# Patient Record
Sex: Female | Born: 1935 | Race: White | Hispanic: No | Marital: Married | Smoking: Never smoker
Health system: Southern US, Community
[De-identification: ages and names within clinical notes are randomized; demographics above are authoritative.]

## PROBLEM LIST (undated history)

## (undated) DIAGNOSIS — Z889 Allergy status to unspecified drugs, medicaments and biological substances status: Secondary | ICD-10-CM

## (undated) DIAGNOSIS — M5430 Sciatica, unspecified side: Secondary | ICD-10-CM

## (undated) DIAGNOSIS — M1990 Unspecified osteoarthritis, unspecified site: Secondary | ICD-10-CM

## (undated) DIAGNOSIS — E039 Hypothyroidism, unspecified: Secondary | ICD-10-CM

## (undated) DIAGNOSIS — I10 Essential (primary) hypertension: Secondary | ICD-10-CM

## (undated) HISTORY — PX: NASAL SINUS SURGERY: SHX719

---

## 1997-12-04 ENCOUNTER — Other Ambulatory Visit: Admission: RE | Admit: 1997-12-04 | Discharge: 1997-12-04 | Payer: Self-pay | Admitting: Obstetrics & Gynecology

## 1998-12-25 ENCOUNTER — Other Ambulatory Visit: Admission: RE | Admit: 1998-12-25 | Discharge: 1998-12-25 | Payer: Self-pay | Admitting: Obstetrics & Gynecology

## 1999-05-11 ENCOUNTER — Ambulatory Visit (HOSPITAL_COMMUNITY): Admission: RE | Admit: 1999-05-11 | Discharge: 1999-05-11 | Payer: Self-pay | Admitting: *Deleted

## 1999-05-11 ENCOUNTER — Encounter: Payer: Self-pay | Admitting: *Deleted

## 2000-09-24 ENCOUNTER — Other Ambulatory Visit: Admission: RE | Admit: 2000-09-24 | Discharge: 2000-09-24 | Payer: Self-pay | Admitting: Obstetrics & Gynecology

## 2000-11-26 ENCOUNTER — Encounter: Admission: RE | Admit: 2000-11-26 | Discharge: 2000-11-26 | Payer: Self-pay | Admitting: Internal Medicine

## 2000-11-26 ENCOUNTER — Encounter: Payer: Self-pay | Admitting: Internal Medicine

## 2002-01-10 ENCOUNTER — Other Ambulatory Visit: Admission: RE | Admit: 2002-01-10 | Discharge: 2002-01-10 | Payer: Self-pay | Admitting: Obstetrics & Gynecology

## 2003-03-27 ENCOUNTER — Ambulatory Visit (HOSPITAL_COMMUNITY): Admission: RE | Admit: 2003-03-27 | Discharge: 2003-03-27 | Payer: Self-pay | Admitting: Gastroenterology

## 2005-01-21 ENCOUNTER — Other Ambulatory Visit: Admission: RE | Admit: 2005-01-21 | Discharge: 2005-01-21 | Payer: Self-pay | Admitting: Obstetrics & Gynecology

## 2013-02-03 HISTORY — PX: REPAIR NONUNION / MALUNION FEMUR: SUR1193

## 2013-12-28 ENCOUNTER — Encounter (HOSPITAL_COMMUNITY): Payer: Self-pay

## 2013-12-28 ENCOUNTER — Emergency Department (HOSPITAL_COMMUNITY): Payer: Worker's Compensation

## 2013-12-28 ENCOUNTER — Inpatient Hospital Stay (HOSPITAL_COMMUNITY): Payer: Worker's Compensation | Admitting: Anesthesiology

## 2013-12-28 ENCOUNTER — Inpatient Hospital Stay (HOSPITAL_COMMUNITY): Payer: Worker's Compensation

## 2013-12-28 ENCOUNTER — Encounter (HOSPITAL_COMMUNITY)
Admission: EM | Disposition: A | Discharge status: Home Health | Payer: Self-pay | Source: Home / Self Care | Attending: Internal Medicine

## 2013-12-28 ENCOUNTER — Inpatient Hospital Stay (HOSPITAL_COMMUNITY)
Admission: EM | Admit: 2013-12-28 | Discharge: 2014-01-02 | DRG: 481 | Disposition: A | Discharge status: Home Health | Payer: Worker's Compensation | Attending: Internal Medicine | Admitting: Internal Medicine

## 2013-12-28 DIAGNOSIS — D62 Acute posthemorrhagic anemia: Secondary | ICD-10-CM | POA: Diagnosis not present

## 2013-12-28 DIAGNOSIS — R52 Pain, unspecified: Secondary | ICD-10-CM

## 2013-12-28 DIAGNOSIS — Z01818 Encounter for other preprocedural examination: Secondary | ICD-10-CM | POA: Insufficient documentation

## 2013-12-28 DIAGNOSIS — I10 Essential (primary) hypertension: Secondary | ICD-10-CM | POA: Diagnosis present

## 2013-12-28 DIAGNOSIS — Z880 Allergy status to penicillin: Secondary | ICD-10-CM | POA: Diagnosis not present

## 2013-12-28 DIAGNOSIS — S7223XA Displaced subtrochanteric fracture of unspecified femur, initial encounter for closed fracture: Secondary | ICD-10-CM | POA: Insufficient documentation

## 2013-12-28 DIAGNOSIS — S7221XA Displaced subtrochanteric fracture of right femur, initial encounter for closed fracture: Secondary | ICD-10-CM | POA: Diagnosis present

## 2013-12-28 DIAGNOSIS — E876 Hypokalemia: Secondary | ICD-10-CM | POA: Diagnosis not present

## 2013-12-28 DIAGNOSIS — W19XXXA Unspecified fall, initial encounter: Secondary | ICD-10-CM | POA: Diagnosis present

## 2013-12-28 DIAGNOSIS — S7291XA Unspecified fracture of right femur, initial encounter for closed fracture: Secondary | ICD-10-CM

## 2013-12-28 DIAGNOSIS — S72009A Fracture of unspecified part of neck of unspecified femur, initial encounter for closed fracture: Secondary | ICD-10-CM | POA: Insufficient documentation

## 2013-12-28 DIAGNOSIS — E039 Hypothyroidism, unspecified: Secondary | ICD-10-CM | POA: Diagnosis present

## 2013-12-28 DIAGNOSIS — S72001A Fracture of unspecified part of neck of right femur, initial encounter for closed fracture: Secondary | ICD-10-CM

## 2013-12-28 HISTORY — DX: Essential (primary) hypertension: I10

## 2013-12-28 HISTORY — DX: Allergy status to unspecified drugs, medicaments and biological substances: Z88.9

## 2013-12-28 HISTORY — PX: FEMUR IM NAIL: SHX1597

## 2013-12-28 HISTORY — PX: FEMUR FRACTURE SURGERY: SHX633

## 2013-12-28 LAB — CBC WITH DIFFERENTIAL/PLATELET
BASOS ABS: 0 10*3/uL (ref 0.0–0.1)
BASOS PCT: 0 % (ref 0–1)
EOS PCT: 3 % (ref 0–5)
Eosinophils Absolute: 0.2 10*3/uL (ref 0.0–0.7)
HEMATOCRIT: 35.1 % — AB (ref 36.0–46.0)
Hemoglobin: 11.6 g/dL — ABNORMAL LOW (ref 12.0–15.0)
LYMPHS PCT: 19 % (ref 12–46)
Lymphs Abs: 1.6 10*3/uL (ref 0.7–4.0)
MCH: 28.9 pg (ref 26.0–34.0)
MCHC: 33 g/dL (ref 30.0–36.0)
MCV: 87.3 fL (ref 78.0–100.0)
MONO ABS: 0.3 10*3/uL (ref 0.1–1.0)
MONOS PCT: 4 % (ref 3–12)
Neutro Abs: 6.3 10*3/uL (ref 1.7–7.7)
Neutrophils Relative %: 74 % (ref 43–77)
Platelets: 206 10*3/uL (ref 150–400)
RBC: 4.02 MIL/uL (ref 3.87–5.11)
RDW: 13.4 % (ref 11.5–15.5)
WBC: 8.4 10*3/uL (ref 4.0–10.5)

## 2013-12-28 LAB — BASIC METABOLIC PANEL
Anion gap: 13 (ref 5–15)
BUN: 25 mg/dL — ABNORMAL HIGH (ref 6–23)
CO2: 26 meq/L (ref 19–32)
CREATININE: 0.83 mg/dL (ref 0.50–1.10)
Calcium: 9 mg/dL (ref 8.4–10.5)
Chloride: 98 mEq/L (ref 96–112)
GFR calc Af Amer: 77 mL/min — ABNORMAL LOW (ref >90)
GFR calc non Af Amer: 67 mL/min — ABNORMAL LOW (ref >90)
GLUCOSE: 119 mg/dL — AB (ref 70–99)
Potassium: 3.4 mEq/L — ABNORMAL LOW (ref 3.7–5.3)
Sodium: 137 mEq/L (ref 137–147)

## 2013-12-28 LAB — MRSA PCR SCREENING: MRSA by PCR: NEGATIVE

## 2013-12-28 LAB — ABO/RH: ABO/RH(D): O POS

## 2013-12-28 LAB — PROTIME-INR
INR: 1.08 (ref 0.00–1.49)
Prothrombin Time: 14.1 seconds (ref 11.6–15.2)

## 2013-12-28 IMAGING — CR DG HIP 1V PORT*R*
1 series · 1 of 1 positions shown · non-contrast
Comparison: [DATE]

CLINICAL DATA: Postop right hip fracture

EXAM:
PORTABLE RIGHT HIP - 1 VIEW

[AP]
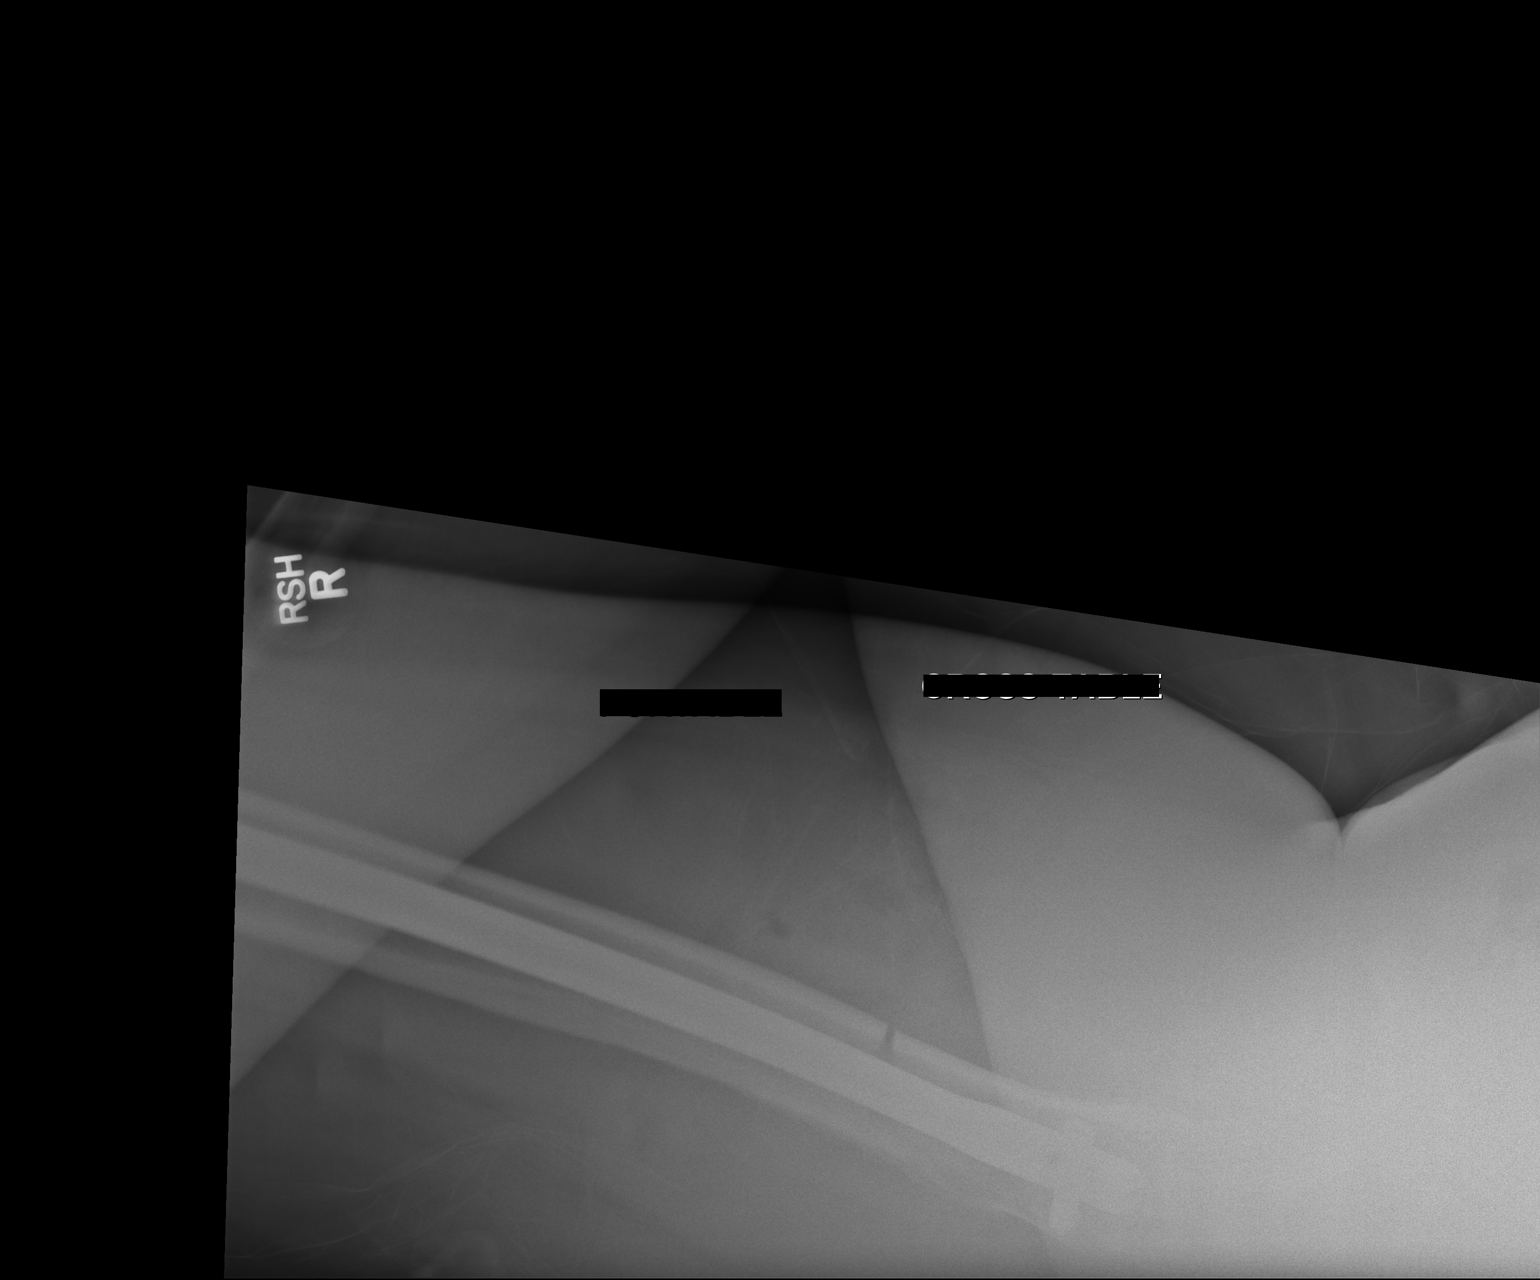

[1 of 1 positions shown; findings below may reference images not displayed]

FINDINGS: Lateral image of the right hip shows changes of subtrochanteric
femur fracture ORIF using an intra medullary nail. In the lateral
projection the fracture alignment is excellent.
IMPRESSION: Subtrochanteric right femur fracture ORIF.  No adverse findings.

## 2013-12-28 SURGERY — INSERTION, INTRAMEDULLARY ROD, FEMUR
Anesthesia: General | Site: Leg Upper | Laterality: Right

## 2013-12-28 MED ORDER — LIDOCAINE HCL (CARDIAC) 20 MG/ML IV SOLN
INTRAVENOUS | Status: AC
  Filled 2013-12-28: qty 5

## 2013-12-28 MED ORDER — METHOCARBAMOL 500 MG PO TABS: 500.0000 mg | ORAL_TABLET | Freq: Four times a day (QID) | ORAL | Status: DC | PRN

## 2013-12-28 MED ORDER — ONDANSETRON HCL 4 MG/2ML IJ SOLN: 4.0000 mg | Freq: Once | INTRAMUSCULAR | Status: AC | PRN

## 2013-12-28 MED ORDER — SENNA-DOCUSATE SODIUM 8.6-50 MG PO TABS: 2.0000 | ORAL_TABLET | Freq: Every day | ORAL | Status: AC

## 2013-12-28 MED ORDER — MIDAZOLAM HCL 5 MG/5ML IJ SOLN
INTRAMUSCULAR | Status: DC | PRN
  Administered 2013-12-28: 2 mg via INTRAVENOUS

## 2013-12-28 MED ORDER — ONDANSETRON HCL 4 MG/2ML IJ SOLN: 4.0000 mg | Freq: Three times a day (TID) | INTRAMUSCULAR | Status: DC | PRN

## 2013-12-28 MED ORDER — LACTATED RINGERS IV SOLN
INTRAVENOUS | Status: DC | PRN
  Administered 2013-12-28 (×2): via INTRAVENOUS

## 2013-12-28 MED ORDER — POTASSIUM CHLORIDE CRYS ER 10 MEQ PO TBCR
10.0000 meq | EXTENDED_RELEASE_TABLET | Freq: Every day | ORAL | Status: DC
  Administered 2013-12-29: 10 meq via ORAL
  Filled 2013-12-28 (×2): qty 1

## 2013-12-28 MED ORDER — HEPARIN SODIUM (PORCINE) 5000 UNIT/ML IJ SOLN: 5000.0000 [IU] | Freq: Three times a day (TID) | INTRAMUSCULAR | Status: DC

## 2013-12-28 MED ORDER — AMLODIPINE BESY-BENAZEPRIL HCL 10-20 MG PO CAPS: 1.0000 | ORAL_CAPSULE | Freq: Every day | ORAL | Status: DC

## 2013-12-28 MED ORDER — ROPIVACAINE HCL 5 MG/ML IJ SOLN
INTRAMUSCULAR | Status: DC | PRN
  Administered 2013-12-28: 20 mL via PERINEURAL

## 2013-12-28 MED ORDER — 0.9 % SODIUM CHLORIDE (POUR BTL) OPTIME
TOPICAL | Status: DC | PRN
  Administered 2013-12-28: 1000 mL

## 2013-12-28 MED ORDER — MORPHINE SULFATE 2 MG/ML IJ SOLN
0.5000 mg | INTRAMUSCULAR | Status: DC | PRN
Start: 2013-12-28 — End: 2013-12-29

## 2013-12-28 MED ORDER — HYDROCODONE-ACETAMINOPHEN 5-325 MG PO TABS: 1.0000 | ORAL_TABLET | Freq: Four times a day (QID) | ORAL | Status: DC | PRN

## 2013-12-28 MED ORDER — FENTANYL CITRATE 0.05 MG/ML IJ SOLN
INTRAMUSCULAR | Status: AC
  Filled 2013-12-28: qty 5

## 2013-12-28 MED ORDER — AMLODIPINE BESYLATE 10 MG PO TABS
10.0000 mg | ORAL_TABLET | Freq: Every day | ORAL | Status: DC
  Administered 2013-12-29 – 2014-01-02 (×5): 10 mg via ORAL
  Filled 2013-12-28 (×6): qty 1

## 2013-12-28 MED ORDER — LIDOCAINE HCL (CARDIAC) 20 MG/ML IV SOLN
INTRAVENOUS | Status: DC | PRN
  Administered 2013-12-28: 80 mg via INTRAVENOUS

## 2013-12-28 MED ORDER — SUCCINYLCHOLINE CHLORIDE 20 MG/ML IJ SOLN
INTRAMUSCULAR | Status: AC
  Filled 2013-12-28: qty 1

## 2013-12-28 MED ORDER — HYDROCODONE-ACETAMINOPHEN 5-325 MG PO TABS: 1.0000 | ORAL_TABLET | ORAL | Status: DC | PRN

## 2013-12-28 MED ORDER — HYDROMORPHONE HCL 1 MG/ML IJ SOLN
1.0000 mg | Freq: Once | INTRAMUSCULAR | Status: AC
Start: 2013-12-28 — End: 2013-12-28
  Administered 2013-12-28: 1 mg via INTRAVENOUS
  Filled 2013-12-28: qty 1

## 2013-12-28 MED ORDER — ROCURONIUM BROMIDE 50 MG/5ML IV SOLN
INTRAVENOUS | Status: AC
  Filled 2013-12-28: qty 1

## 2013-12-28 MED ORDER — NEOSTIGMINE METHYLSULFATE 10 MG/10ML IV SOLN
INTRAVENOUS | Status: AC
  Filled 2013-12-28: qty 1

## 2013-12-28 MED ORDER — FENTANYL CITRATE 0.05 MG/ML IJ SOLN
INTRAMUSCULAR | Status: DC | PRN
  Administered 2013-12-28: 100 ug via INTRAVENOUS
  Administered 2013-12-28: 50 ug via INTRAVENOUS

## 2013-12-28 MED ORDER — POTASSIUM CHLORIDE IN NACL 20-0.45 MEQ/L-% IV SOLN
INTRAVENOUS | Status: DC
  Administered 2013-12-28: 21:00:00 via INTRAVENOUS
  Filled 2013-12-28 (×2): qty 1000

## 2013-12-28 MED ORDER — ONDANSETRON HCL 4 MG/2ML IJ SOLN
INTRAMUSCULAR | Status: AC
  Filled 2013-12-28: qty 2

## 2013-12-28 MED ORDER — FENTANYL CITRATE 0.05 MG/ML IJ SOLN
50.0000 ug | INTRAMUSCULAR | Status: AC | PRN
Start: 2013-12-28 — End: 2013-12-28
  Administered 2013-12-28 (×2): 50 ug via INTRAVENOUS
  Filled 2013-12-28 (×2): qty 2

## 2013-12-28 MED ORDER — POLYETHYLENE GLYCOL 3350 17 G PO PACK: 17.0000 g | PACK | Freq: Every day | ORAL | Status: DC | PRN

## 2013-12-28 MED ORDER — CEFAZOLIN SODIUM-DEXTROSE 2-3 GM-% IV SOLR
INTRAVENOUS | Status: DC | PRN
  Administered 2013-12-28: 2 g via INTRAVENOUS

## 2013-12-28 MED ORDER — PROPOFOL 10 MG/ML IV BOLUS
INTRAVENOUS | Status: AC
  Filled 2013-12-28: qty 20

## 2013-12-28 MED ORDER — MIDAZOLAM HCL 2 MG/2ML IJ SOLN
INTRAMUSCULAR | Status: AC
  Filled 2013-12-28: qty 2

## 2013-12-28 MED ORDER — PHENYLEPHRINE HCL 10 MG/ML IJ SOLN
10.0000 mg | INTRAVENOUS | Status: DC | PRN
  Administered 2013-12-28: 40 ug/min via INTRAVENOUS

## 2013-12-28 MED ORDER — SODIUM CHLORIDE 0.9 % IV SOLN
1000.0000 mL | INTRAVENOUS | Status: DC
  Administered 2013-12-28: 1000 mL via INTRAVENOUS

## 2013-12-28 MED ORDER — BUPIVACAINE-EPINEPHRINE (PF) 0.5% -1:200000 IJ SOLN
INTRAMUSCULAR | Status: DC | PRN
Start: 2013-12-28 — End: 2013-12-29
  Administered 2013-12-28: 20 mL via PERINEURAL

## 2013-12-28 MED ORDER — METHOCARBAMOL 1000 MG/10ML IJ SOLN
500.0000 mg | Freq: Four times a day (QID) | INTRAVENOUS | Status: DC | PRN
  Filled 2013-12-28: qty 5

## 2013-12-28 MED ORDER — CEFAZOLIN SODIUM-DEXTROSE 2-3 GM-% IV SOLR
2.0000 g | INTRAVENOUS | Status: AC
  Administered 2013-12-28: 2 g via INTRAVENOUS
  Filled 2013-12-28: qty 50

## 2013-12-28 MED ORDER — HYDROMORPHONE HCL 1 MG/ML IJ SOLN
0.5000 mg | INTRAMUSCULAR | Status: DC | PRN
  Administered 2013-12-28: 0.5 mg via INTRAVENOUS
  Filled 2013-12-28: qty 1

## 2013-12-28 MED ORDER — LEVOTHYROXINE SODIUM 88 MCG PO TABS
88.0000 ug | ORAL_TABLET | Freq: Every day | ORAL | Status: DC
  Administered 2013-12-29 – 2014-01-02 (×5): 88 ug via ORAL
  Filled 2013-12-28 (×6): qty 1

## 2013-12-28 MED ORDER — BENAZEPRIL HCL 20 MG PO TABS
20.0000 mg | ORAL_TABLET | Freq: Every day | ORAL | Status: DC
Start: 2013-12-28 — End: 2014-01-02
  Administered 2013-12-29 – 2014-01-01 (×4): 20 mg via ORAL
  Filled 2013-12-28 (×6): qty 1

## 2013-12-28 MED ORDER — FENTANYL CITRATE 0.05 MG/ML IJ SOLN
25.0000 ug | INTRAMUSCULAR | Status: DC | PRN
Start: 2013-12-28 — End: 2013-12-29
  Administered 2013-12-29: 50 ug via INTRAVENOUS

## 2013-12-28 MED ORDER — ENOXAPARIN SODIUM 40 MG/0.4ML ~~LOC~~ SOLN: 40.0000 mg | SUBCUTANEOUS | Status: DC

## 2013-12-28 MED ORDER — SUCCINYLCHOLINE CHLORIDE 20 MG/ML IJ SOLN
INTRAMUSCULAR | Status: DC | PRN
  Administered 2013-12-28: 100 mg via INTRAVENOUS

## 2013-12-28 MED ORDER — LABETALOL HCL 300 MG PO TABS
300.0000 mg | ORAL_TABLET | Freq: Two times a day (BID) | ORAL | Status: DC
  Administered 2013-12-29 – 2014-01-02 (×7): 300 mg via ORAL
  Filled 2013-12-28 (×11): qty 1

## 2013-12-28 MED ORDER — GLYCOPYRROLATE 0.2 MG/ML IJ SOLN
INTRAMUSCULAR | Status: AC
  Filled 2013-12-28: qty 2

## 2013-12-28 MED ORDER — ONDANSETRON HCL 4 MG/2ML IJ SOLN
INTRAMUSCULAR | Status: DC | PRN
  Administered 2013-12-28: 4 mg via INTRAVENOUS

## 2013-12-28 MED ORDER — SODIUM CHLORIDE 0.9 % IV SOLN
INTRAVENOUS | Status: DC
  Filled 2013-12-28: qty 1000

## 2013-12-28 MED ORDER — PROPOFOL 10 MG/ML IV BOLUS
INTRAVENOUS | Status: DC | PRN
  Administered 2013-12-28: 80 mg via INTRAVENOUS

## 2013-12-28 SURGICAL SUPPLY — 48 items
APL SKNCLS STERI-STRIP NONHPOA (GAUZE/BANDAGES/DRESSINGS) ×1
BENZOIN TINCTURE PRP APPL 2/3 (GAUZE/BANDAGES/DRESSINGS) ×3 IMPLANT
BIT DRILL 3.8X6 NS (BIT) ×2 IMPLANT
BIT DRILL 5.3 NS (BIT) ×2 IMPLANT
BOOTCOVER CLEANROOM LRG (PROTECTIVE WEAR) ×6 IMPLANT
CLOSURE STERI-STRIP 1/2X4 (GAUZE/BANDAGES/DRESSINGS) ×1
CLSR STERI-STRIP ANTIMIC 1/2X4 (GAUZE/BANDAGES/DRESSINGS) ×2 IMPLANT
COVER MAYO STAND STRL (DRAPES) ×2 IMPLANT
COVER PERINEAL POST (MISCELLANEOUS) ×3 IMPLANT
COVER SURGICAL LIGHT HANDLE (MISCELLANEOUS) ×3 IMPLANT
DRAPE STERI IOBAN 125X83 (DRAPES) ×3 IMPLANT
DRSG MEPILEX BORDER 4X4 (GAUZE/BANDAGES/DRESSINGS) ×7 IMPLANT
DRSG MEPILEX BORDER 4X8 (GAUZE/BANDAGES/DRESSINGS) ×6 IMPLANT
DURAPREP 26ML APPLICATOR (WOUND CARE) ×3 IMPLANT
ELECT CAUTERY BLADE 6.4 (BLADE) ×3 IMPLANT
ELECT REM PT RETURN 9FT ADLT (ELECTROSURGICAL) ×3
ELECTRODE REM PT RTRN 9FT ADLT (ELECTROSURGICAL) ×1 IMPLANT
EVACUATOR 1/8 PVC DRAIN (DRAIN) IMPLANT
FACESHIELD WRAPAROUND (MASK) ×6 IMPLANT
FACESHIELD WRAPAROUND OR TEAM (MASK) ×2 IMPLANT
GAUZE XEROFORM 5X9 LF (GAUZE/BANDAGES/DRESSINGS) ×3 IMPLANT
GLOVE BIOGEL PI ORTHO PRO SZ8 (GLOVE) ×2
GLOVE ORTHO TXT STRL SZ7.5 (GLOVE) ×6 IMPLANT
GLOVE PI ORTHO PRO STRL SZ8 (GLOVE) ×1 IMPLANT
GLOVE SURG ORTHO 8.0 STRL STRW (GLOVE) ×6 IMPLANT
GOWN STRL REUS W/ TWL LRG LVL3 (GOWN DISPOSABLE) IMPLANT
GOWN STRL REUS W/TWL LRG LVL3 (GOWN DISPOSABLE) ×3
GUIDEWIRE BALL NOSE 100CM (WIRE) ×2 IMPLANT
KIT ROOM TURNOVER OR (KITS) ×3 IMPLANT
LINER BOOT UNIVERSAL DISP (MISCELLANEOUS) ×3 IMPLANT
MANIFOLD NEPTUNE II (INSTRUMENTS) ×3 IMPLANT
NAIL TROCH RH 11MMX38CM (Nail) ×2 IMPLANT
NS IRRIG 1000ML POUR BTL (IV SOLUTION) ×3 IMPLANT
PACK GENERAL/GYN (CUSTOM PROCEDURE TRAY) ×3 IMPLANT
PAD ARMBOARD 7.5X6 YLW CONV (MISCELLANEOUS) ×6 IMPLANT
SCREW ACE CORTICAL (Screw) ×6 IMPLANT
SCREW ACECAP 44MM (Screw) ×2 IMPLANT
SCREW BN FT 75X6.5XST DRV (Screw) IMPLANT
SCREW BN FT 80X6.5XST DRV (Screw) IMPLANT
STAPLER VISISTAT 35W (STAPLE) ×3 IMPLANT
SUT VIC AB 0 CTB1 27 (SUTURE) ×3 IMPLANT
SUT VIC AB 2-0 FS1 27 (SUTURE) ×1 IMPLANT
SUT VIC AB 2-0 SH 27 (SUTURE)
SUT VIC AB 2-0 SH 27XBRD (SUTURE) IMPLANT
SUT VIC AB 3-0 SH 8-18 (SUTURE) ×3 IMPLANT
TOWEL OR 17X24 6PK STRL BLUE (TOWEL DISPOSABLE) ×3 IMPLANT
TOWEL OR 17X26 10 PK STRL BLUE (TOWEL DISPOSABLE) ×3 IMPLANT
WATER STERILE IRR 1000ML POUR (IV SOLUTION) ×3 IMPLANT

## 2013-12-28 NOTE — Anesthesia Procedure Notes (Addendum)
Date/Time: 12/28/2013 9:35 PM Performed by: Isidore MoosPAXTON, LYNN A Patient Re-evaluated:Patient Re-evaluated prior to inductionOxygen Delivery Method: Circle system utilized Preoxygenation: Pre-oxygenation with 100% oxygen Intubation Type: IV induction Ventilation: Mask ventilation without difficulty Laryngoscope Size: Miller and 2 Grade View: Grade I Tube type: Oral Tube size: 7.5 mm Number of attempts: 1 Airway Equipment and Method: Stylet   Anesthesia Regional Block:  Femoral nerve block  Pre-Anesthetic Checklist: ,, timeout performed, Correct Patient, Correct Site, Correct Laterality, Correct Procedure, Correct Position, site marked, Risks and benefits discussed,  Surgical consent,  Pre-op evaluation,  At surgeon's request and post-op pain management  Laterality: Right  Prep: chloraprep       Needles:  Injection technique: Single-shot  Needle Type: Echogenic Needle     Needle Length: 9cm 9 cm Needle Gauge: 21 and 21 G    Additional Needles:  Procedures: ultrasound guided (picture in chart) Femoral nerve block Narrative:  Start time: 12/28/2013 11:14 PM End time: 12/28/2013 11:20 PM Injection made incrementally with aspirations every 5 mL.  Performed by: Personally  Anesthesiologist: Shawntelle Ungar A  Additional Notes: Block performed was Fascia Iliaca Block, though the template states Femoral Block.

## 2013-12-28 NOTE — Progress Notes (Signed)
Orthopedic Tech Progress Note Patient Details:  Molly [name] 09/11/1935 [id number] Patient refused ohf  Patient ID: Molly FessMary A [name], female   DOB: 09/11/1935, 78 y.o.   MRN: [id number]   Jennye MoccasinHughes, [name] 12/28/2013, 7:31 PM

## 2013-12-28 NOTE — ED Notes (Signed)
Ortho Tech paged for traction

## 2013-12-28 NOTE — Op Note (Signed)
DATE OF SURGERY:  12/28/2013  TIME: 11:25 PM  PATIENT NAME:  Molly [name]  AGE: 78 y.o.  PRE-OPERATIVE DIAGNOSIS:  Right subtrochanteric femur fracture  POST-OPERATIVE DIAGNOSIS:  SAME  PROCEDURE:  Intramedullary nail, right subtrochanteric femur fracture  SURGEON:  Can Lucci P  ASSISTANT:  Janace LittenBrandon Parry, OPA-C, present and scrubbed throughout the case, critical for assistance with exposure, retraction, instrumentation, and closure.  OPERATIVE IMPLANTS: Biomet antegrade trochanteric femoral nail with a proximal interlocking bolts from the greater trochanter to the lesser trochanter and a single distal interlocking bolt  PREOPERATIVE INDICATIONS:  Molly [name] is a 78 y.o. year old who fell and suffered a hip fracture. She was brought into the ER and then admitted and optimized and then elected for surgical intervention.    The risks benefits and alternatives were discussed with the patient including but not limited to the risks of nonoperative treatment, versus surgical intervention including infection, bleeding, nerve injury, malunion, nonunion, hardware prominence, hardware failure, need for hardware removal, blood clots, cardiopulmonary complications, morbidity, mortality, among others, and they were willing to proceed.    OPERATIVE PROCEDURE:  The patient was brought to the operating room and placed in the supine position. General anesthesia was administered.  A Foley was not utilized. She was placed on the fracture table.  Closed reduction was not possible due to the fracture pattern.  She received preoperative antibiotics in the form of Ancef, without any significant reaction. This was despite her penicillin allergy.  After a sterile prep and drape, I made a small incision over the femur at the fracture site, and applied a clamp taking care to preserve soft tissue coverage. This reduced the fracture anatomically.  Incision was made proximal to the greater trochanter. A  guidewire was placed in the appropriate position. Confirmation was made on AP and lateral views. The above-named nail was opened. I opened the proximal femur with a reamer. I then reamed up to a 12.5, as the femur was fairly open, and then placed a size 11 nail.  Once the nail was completely seated, I secured the nail proximally with with a screw from the greater trochanter to lesser trochanter. I had excellent purchase. There was no femoral neck fracture, which I scrutinized preoperatively, and therefore did not need fixation into the head.   I then went distally, and secured the nail with a distal interlocking bolt.  Rotational alignment was confirmed clinically, as well as by radiographic alignment of the cortices thickness. I secured the nail distally with perfect circles technique.  I then removed the instruments, and took final C-arm pictures AP and lateral the entire length of the leg. Anatomic reconstruction was achieved, and the wounds were irrigated copiously and closed with Vicryl followed by Steri-Strips and sterile gauze for the skin. The patient was awakened and returned to PACU in stable and satisfactory condition. There no complications and the patient tolerated the procedure well.  She will be weightbearing as tolerated, and will be on Lovenox  for a period of three weeks after discharge.   Teryl LucyJoshua Dalayza Zambrana, M.D.

## 2013-12-28 NOTE — Progress Notes (Signed)
Orthopedic Tech Progress Note Patient Details:  Molly [name] 09/11/1935 [id number] Applied Hare traction splint to RLE.  Pulses, sensation, motion intact before and after application.  Capillary refill less than 2 seconds before and after application. Musculoskeletal Traction Type of Traction: Hare Traction Traction Location: RLE    Lesle ChrisGilliland, Tajha Sammarco L 12/28/2013, 2:28 PM

## 2013-12-28 NOTE — H&P (Signed)
[facility] History and Physical  Molly [name] ZOX:[id number] DOB: 09/11/1935 DOA: 12/28/2013  Referring physician: Dr. Fonnie Jarvis PCP: Lillia Mountain, MD   Chief Complaint: right hip pain  HPI: Molly [name] is a 78 y.o. female  78 year old female with past medical history of hypertension that lives with her family, that comes in for severe right hip pain and thigh pain after a fall. The family relates that she fell from her feet did not loose consciousness, no weakness or numbness. She did not hit her head she just has no chest pain or back pain. She relates her last meal was more than 4 hours prior to visit to the ED.  No diarrhea, nausea, vomiting, dysuria, cough or shortness of breath.  In the ED: She was given IV morphine x-rays was done as below and we are consulted for further evaluation.   Review of Systems:  Constitutional:  No weight loss, night sweats, Fevers, chills, fatigue.  HEENT:  No headaches, Difficulty swallowing,Tooth/dental problems,Sore throat,  No sneezing, itching, ear ache, nasal congestion, post nasal drip,  Cardio-vascular:  No chest pain, Orthopnea, PND, swelling in lower extremities, anasarca, dizziness, palpitations  GI:  No heartburn, indigestion, abdominal pain, nausea, vomiting, diarrhea, change in bowel habits, loss of appetite  Resp:  No shortness of breath with exertion or at rest. No excess mucus, no productive cough, No non-productive cough, No coughing up of blood.No change in color of mucus.No wheezing.No chest wall deformity  Skin:  no rash or lesions.  GU:  no dysuria, change in color of urine, no urgency or frequency. No flank pain.  Musculoskeletal:  No joint pain or swelling. No decreased range of motion. No back pain.  Psych:  No change in mood or affect. No depression or anxiety. No memory loss.   Past Medical History  Diagnosis Date  . Hypertension    History reviewed. No pertinent past surgical history. Social  History:  reports that she has never smoked. She does not have any smokeless tobacco history on file. She reports that she does not drink alcohol or use illicit drugs.  Allergies  Allergen Reactions  . Penicillins Diarrhea, Itching and Swelling    Family History  Problem Relation Age of Onset  . Lung cancer Father      Prior to Admission medications   Medication Sig Start Date End Date Taking? Authorizing Provider  amLODipine-benazepril (LOTREL) 10-20 MG per capsule Take 1 capsule by mouth daily. 12/07/13  Yes Historical Provider, MD  CALCIUM PO Take 1 tablet by mouth daily.    Yes Historical Provider, MD  hydrochlorothiazide (HYDRODIURIL) 25 MG tablet Take 25 mg by mouth daily. 10/07/13  Yes Historical Provider, MD  labetalol (NORMODYNE) 300 MG tablet Take 300 mg by mouth 2 (two) times daily. 12/28/13  Yes Historical Provider, MD  levothyroxine (SYNTHROID, LEVOTHROID) 88 MCG tablet Take 88 mcg by mouth daily. 12/13/13  Yes Historical Provider, MD  Multiple Vitamin (MULTIVITAMIN WITH MINERALS) TABS tablet Take 1 tablet by mouth daily.   Yes Historical Provider, MD  potassium chloride (K-DUR,KLOR-CON) 10 MEQ tablet Take 10 mEq by mouth daily. 12/22/13  Yes Historical Provider, MD   Physical Exam: Filed Vitals:   12/28/13 1400 12/28/13 1404 12/28/13 1415 12/28/13 1445  BP:  141/70 140/62 145/71  Pulse:  64 63 66  Temp:  97.7 F (36.5 C)    TempSrc:  Oral    Resp:  18 17 14   Height: 5\' 2"  (1.575 m)  Weight: 63.504 kg (140 lb)     SpO2:  100% 93% 99%    Wt Readings from Last 3 Encounters:  12/28/13 63.504 kg (140 lb)    General:  Appears calm and comfortable Eyes: PERRL, normal lids, irises & conjunctiva ENT: grossly normal hearing, lips & tongue Neck: no LAD, masses or thyromegaly Cardiovascular: RRR, no m/r/g. No LE edema. Telemetry: SR, no arrhythmias  Respiratory: CTA bilaterally, no w/r/r. Normal respiratory effort. Abdomen: soft, ntnd Skin: no rash or induration seen  on limited exam Musculoskeletal: Tenderness in her left hip able to move all 4 extremities except her right lower extremity which is on extender  Psychiatric: grossly normal mood and affect, speech fluent and appropriate Neurologic: grossly non-focal.          Labs on Admission:  Basic Metabolic Panel:  Recent Labs Lab 12/28/13 1455  NA 137  K 3.4*  CL 98  CO2 26  GLUCOSE 119*  BUN 25*  CREATININE 0.83  CALCIUM 9.0   Liver Function Tests: No results for input(s): AST, ALT, ALKPHOS, BILITOT, PROT, ALBUMIN in the last 168 hours. No results for input(s): LIPASE, AMYLASE in the last 168 hours. No results for input(s): AMMONIA in the last 168 hours. CBC:  Recent Labs Lab 12/28/13 1455  WBC 8.4  NEUTROABS 6.3  HGB 11.6*  HCT 35.1*  MCV 87.3  PLT 206   Cardiac Enzymes: No results for input(s): CKTOTAL, CKMB, CKMBINDEX, TROPONINI in the last 168 hours.  BNP (last 3 results) No results for input(s): PROBNP in the last 8760 hours. CBG: No results for input(s): GLUCAP in the last 168 hours.  Radiological Exams on Admission: Dg Chest 1 View  12/28/2013   CLINICAL DATA:  Fall, hip injury  EXAM: CHEST - 1 VIEW  COMPARISON:  None.  FINDINGS: Cardiac silhouette is enlarged. The aorta is ectatic. Central venous congestion. No overt pulmonary edema. No pleural fluid or pneumothorax.  IMPRESSION: Central venous congestion.  No overt pulmonary edema.   Electronically Signed   By: Genevive BiStewart  Edmunds M.D.   On: 12/28/2013 16:37   Dg Pelvis 1-2 Views  12/28/2013   CLINICAL DATA:  Fall.  Pelvic pain.  Initial encounter.  EXAM: PELVIS - 1-2 VIEW  COMPARISON:  None.  FINDINGS: There is no evidence of pelvic fracture or diastasis. No pelvic bone lesions are seen. Proximal right femoral shaft fracture demonstrated.  IMPRESSION: No evidence of pelvic fracture. Proximal right femur fracture noted.   Electronically Signed   By: Myles RosenthalJohn  Stahl M.D.   On: 12/28/2013 16:36   Dg Femur  Right  12/28/2013   CLINICAL DATA:  Status post fall today with a right femur fracture. Initial encounter.  EXAM: RIGHT FEMUR - 2 VIEW  COMPARISON:  None.  FINDINGS: The patient is in traction. The patient has a subtrochanteric right femur fracture with 1 shaft with medial displacement. The fracture has a spiral configuration and is centered 11 cm below the top of the greater trochanter. No other acute bony or joint abnormality is identified.  IMPRESSION: Acute subtrochanteric right femur fracture as described.   Electronically Signed   By: Drusilla Kannerhomas  Dalessio M.D.   On: 12/28/2013 16:37    EKG: Independently reviewed. normal sinus rhythm prolonged PR normal axis no T wave abnormalities.  Assessment/Plan  Preoperative evaluation to rule out surgical contraindication: - She could walk more than 4 blocks without getting short of breath she denies any chest pain with exertion. She relates she regularly exercise walks  every day asa excercise. She works out more than 4 metabolic embolic equivalents.  - I have explained to the family she is low risk for any cardiopulmonary complications. So is only prudent to proceed with surgical intervention. They have agreed. - Her plates are within normal limits her last meal was more than 4 hours ago.  - Hold blood pressure medication resume in the morning.  - Start her on IV fluids with potassium and she has mild hypokalemia. Recheck a basic metabolic panel in the morning.   Subtrochanteric fracture of right femur - Further management per orthopedic surgery.  - Nothing by mouth   Essential hypertension - The blood pressure is stable, will hold her blood pressure medications today can be resumed in the morning except her diuretic. She seems mildly intravascularly depleted.   Hypokalemia: - She is currently nothing by mouth replete IV.    Code Status: full DVT Prophylaxis:heparin Family Communication: son Disposition Plan: inpatient  Time spent: 70  minutes  FELIZ Rosine BeatORTIZ, Aniko Finnigan [facility] Pager [phone]

## 2013-12-28 NOTE — ED Notes (Signed)
Pt given fentanyl and zofran by ems,

## 2013-12-28 NOTE — Anesthesia Preprocedure Evaluation (Addendum)
Anesthesia Evaluation  Patient identified by MRN, date of birth, ID band Patient awake    Reviewed: Allergy & Precautions, H&P , NPO status , Patient's Chart, lab work & pertinent test results  Airway Mallampati: I  TM Distance: >3 FB Neck ROM: Full    Dental  (+) Teeth Intact, Upper Dentures, Dental Advisory Given   Pulmonary  breath sounds clear to auscultation        Cardiovascular hypertension, Pt. on medications and Pt. on home beta blockers Rhythm:Regular Rate:Normal     Neuro/Psych    GI/Hepatic   Endo/Other    Renal/GU      Musculoskeletal   Abdominal   Peds  Hematology   Anesthesia Other Findings Femur fracture- Right  Reproductive/Obstetrics                            Anesthesia Physical Anesthesia Plan  ASA: II  Anesthesia Plan: General   Post-op Pain Management:    Induction: Intravenous  Airway Management Planned: Oral ETT  Additional Equipment:   Intra-op Plan:   Post-operative Plan: Extubation in OR  Informed Consent: I have reviewed the patients History and Physical, chart, labs and discussed the procedure including the risks, benefits and alternatives for the proposed anesthesia with the patient or authorized representative who has indicated his/her understanding and acceptance.   Dental advisory given  Plan Discussed with: CRNA, Anesthesiologist and Surgeon  Anesthesia Plan Comments: (Will not use Scop patch due to pt age)      Anesthesia Quick Evaluation

## 2013-12-28 NOTE — Transfer of Care (Signed)
Immediate Anesthesia Transfer of Care Note  Patient: Molly [name]  Procedure(s) Performed: Procedure(s): INTRAMEDULLARY (IM) NAIL FEMORAL (Right)  Patient Location: PACU  Anesthesia Type:General and GA combined with regional for post-op pain  Level of Consciousness: oriented, sedated, patient cooperative and responds to stimulation  Airway & Oxygen Therapy: Patient Spontanous Breathing and Patient connected to nasal cannula oxygen  Post-op Assessment: Report given to PACU RN and Post -op Vital signs reviewed and stable  Post vital signs: Reviewed and stable  Complications: No apparent anesthesia complications

## 2013-12-28 NOTE — ED Provider Notes (Signed)
CSN: [id number]     Arrival date & time 12/28/13  1354 History   First MD Initiated Contact with Patient 12/28/13 1359     Chief Complaint  Patient presents with  . Fall  . Hip Injury     (Consider location/radiation/quality/duration/timing/severity/associated sxs/prior Treatment) HPI [age] her old female lives with family; fell at home from standing height just prior to arrival with severe right hip and thigh pain with deformity; unable to bear weight; no distal weakness or numbness no radiation of severe pain no associated symptoms no amnesia no loss of consciousness no head injury no neck pain no back pain no chest pain no shortness breath no abdominal pain. Last oral intake about one hour prior to arrival. Pain currently controlled with fentanyl from EMS patient does not want more pain medicine upon arrival to the ED at this time. Past Medical History  Diagnosis Date  . Hypertension   . H/O seasonal allergies    Past Surgical History  Procedure Laterality Date  . Nasal sinus surgery    . Femur fracture surgery Right 12/28/2013    IM NAIL          Family History  Problem Relation Age of Onset  . Lung cancer Father    History  Substance Use Topics  . Smoking status: Never Smoker   . Smokeless tobacco: Never Used  . Alcohol Use: No   OB History    No data available     Review of Systems 10 Systems reviewed and are negative for acute change except as noted in the HPI.   Allergies  Penicillins  Home Medications   Prior to Admission medications   Medication Sig Start Date End Date Taking? Authorizing Provider  amLODipine-benazepril (LOTREL) 10-20 MG per capsule Take 1 capsule by mouth daily. 12/07/13  Yes Historical Provider, MD  CALCIUM PO Take 1 tablet by mouth daily.    Yes Historical Provider, MD  hydrochlorothiazide (HYDRODIURIL) 25 MG tablet Take 25 mg by mouth daily. 10/07/13  Yes Historical Provider, MD  labetalol (NORMODYNE) 300 MG tablet Take 300 mg by mouth 2  (two) times daily. 12/28/13  Yes Historical Provider, MD  levothyroxine (SYNTHROID, LEVOTHROID) 88 MCG tablet Take 88 mcg by mouth daily. 12/13/13  Yes Historical Provider, MD  Multiple Vitamin (MULTIVITAMIN WITH MINERALS) TABS tablet Take 1 tablet by mouth daily.   Yes Historical Provider, MD  potassium chloride (K-DUR,KLOR-CON) 10 MEQ tablet Take 10 mEq by mouth daily. 12/22/13  Yes Historical Provider, MD  enoxaparin (LOVENOX) 40 MG/0.4ML injection Inject 0.4 mLs (40 mg total) into the skin daily. 12/28/13   Eulas PostJoshua P Landau, MD  HYDROcodone-acetaminophen (NORCO) 5-325 MG per tablet Take 1-2 tablets by mouth every 6 (six) hours as needed for moderate pain. MAXIMUM TOTAL ACETAMINOPHEN DOSE IS 4000 MG PER DAY 12/28/13   Eulas PostJoshua P Landau, MD  sennosides-docusate sodium (SENOKOT-S) 8.6-50 MG tablet Take 2 tablets by mouth daily. 12/28/13   Eulas PostJoshua P Landau, MD   BP 123/56 mmHg  Pulse 88  Temp(Src) 98.8 F (37.1 C) (Oral)  Resp 16  Ht 5\' 2"  (1.575 m)  Wt 140 lb (63.504 kg)  BMI 25.60 kg/m2  SpO2 91% Physical Exam  Constitutional:  Awake, alert, nontoxic appearance.  HENT:  Head: Atraumatic.  Eyes: Right eye exhibits no discharge. Left eye exhibits no discharge.  Neck: Neck supple.  Cervical spine nontender  Cardiovascular: Normal rate and regular rhythm.   No murmur heard. Pulmonary/Chest: Effort normal and breath sounds normal.  No respiratory distress. She has no wheezes. She has no rales. She exhibits no tenderness.  Abdominal: Soft. Bowel sounds are normal. She exhibits no distension and no mass. There is no tenderness. There is no rebound and no guarding.  Musculoskeletal: She exhibits tenderness. She exhibits no edema.  Baseline ROM except right leg, no obvious new focal weakness. Both arms nontender left leg nontender. Right leg has deformity with tenderness at proximal femur with right knee ankle and foot nontender. Foot has normal light touch good movement dorsalis pedis pulse intact  capillary refill less than 2 seconds.  Neurological: She is alert.  Mental status and motor strength appears baseline for patient and situation.  Skin: No rash noted.  Psychiatric: She has a normal mood and affect.  Nursing note and vitals reviewed.   ED Course  Procedures (including critical care time) D/w [facility] would like to try OR tonight; [facility] paged for pre-op clearance. 1700 D/w [facility] for admit. 14 Patient / Family / Caregiver informed of clinical course, understand medical decision-making process, and agree with plan. Labs Review Labs Reviewed  BASIC METABOLIC PANEL - Abnormal; Notable for the following:    Potassium 3.4 (*)    Glucose, Bld 119 (*)    BUN 25 (*)    GFR calc non Af Amer 67 (*)    GFR calc Af Amer 77 (*)    All other components within normal limits  CBC WITH DIFFERENTIAL - Abnormal; Notable for the following:    Hemoglobin 11.6 (*)    HCT 35.1 (*)    All other components within normal limits  CBC - Abnormal; Notable for the following:    WBC 11.8 (*)    RBC 3.07 (*)    Hemoglobin 9.0 (*)    HCT 27.5 (*)    All other components within normal limits  CREATININE, SERUM - Abnormal; Notable for the following:    GFR calc non Af Amer 86 (*)    All other components within normal limits  CBC - Abnormal; Notable for the following:    WBC 12.1 (*)    RBC 3.03 (*)    Hemoglobin 9.1 (*)    HCT 27.2 (*)    All other components within normal limits  BASIC METABOLIC PANEL - Abnormal; Notable for the following:    Potassium 3.3 (*)    Glucose, Bld 153 (*)    Calcium 8.1 (*)    GFR calc non Af Amer 86 (*)    All other components within normal limits  MRSA PCR SCREENING  PROTIME-INR  VITAMIN D 25 HYDROXY  TYPE AND SCREEN  ABO/RH    Imaging Review Dg Chest 1 View  12/28/2013   CLINICAL DATA:  Fall, hip injury  EXAM: CHEST - 1 VIEW  COMPARISON:  None.  FINDINGS: Cardiac silhouette is enlarged. The aorta is ectatic. Central venous congestion. No  overt pulmonary edema. No pleural fluid or pneumothorax.  IMPRESSION: Central venous congestion.  No overt pulmonary edema.   Electronically Signed   By: Genevive Bi M.D.   On: 12/28/2013 16:37   Dg Pelvis 1-2 Views  12/28/2013   CLINICAL DATA:  Fall.  Pelvic pain.  Initial encounter.  EXAM: PELVIS - 1-2 VIEW  COMPARISON:  None.  FINDINGS: There is no evidence of pelvic fracture or diastasis. No pelvic bone lesions are seen. Proximal right femoral shaft fracture demonstrated.  IMPRESSION: No evidence of pelvic fracture. Proximal right femur fracture noted.   Electronically Signed  By: Myles RosenthalJohn  Stahl M.D.   On: 12/28/2013 16:36   Dg Femur Right  12/29/2013   CLINICAL DATA:  Intra medullary nail fixation  EXAM: RIGHT FEMUR - 2 VIEW; DG C-ARM 61-120 MIN  COMPARISON:  12/28/2013 femur radiography  FINDINGS: Fluoroscopy shows placement of a intra medullary nail for fixation of a subtrochanteric femur fracture. Fracture alignment is excellent. No adverse hardware findings.  IMPRESSION: Subtrochanteric femur fracture status post ORIF. No adverse findings.   Electronically Signed   By: Tiburcio PeaJonathan  Watts M.D.   On: 12/29/2013 04:48   Dg Femur Right  12/28/2013   CLINICAL DATA:  Status post fall today with a right femur fracture. Initial encounter.  EXAM: RIGHT FEMUR - 2 VIEW  COMPARISON:  None.  FINDINGS: The patient is in traction. The patient has a subtrochanteric right femur fracture with 1 shaft with medial displacement. The fracture has a spiral configuration and is centered 11 cm below the top of the greater trochanter. No other acute bony or joint abnormality is identified.  IMPRESSION: Acute subtrochanteric right femur fracture as described.   Electronically Signed   By: Drusilla Kannerhomas  Dalessio M.D.   On: 12/28/2013 16:37   Pelvis Portable  12/29/2013   CLINICAL DATA:  Postop right hip.  EXAM: PORTABLE PELVIS 1-2 VIEWS  COMPARISON:  12/28/2013  FINDINGS: Subtrochanteric right femur fracture status post  ORIF. No new osseous abnormality. Fracture alignment is excellent. The imaged pelvic ring is intact. The left hip is intact.  IMPRESSION: Subtrochanteric right femur fracture ORIF.  No adverse findings.   Electronically Signed   By: Tiburcio PeaJonathan  Watts M.D.   On: 12/29/2013 05:03   Hip Portable 1 View Right  12/29/2013   CLINICAL DATA:  Postop right hip fracture  EXAM: PORTABLE RIGHT HIP - 1 VIEW  COMPARISON:  12/28/2013  FINDINGS: Lateral image of the right hip shows changes of subtrochanteric femur fracture ORIF using an intra medullary nail. In the lateral projection the fracture alignment is excellent.  IMPRESSION: Subtrochanteric right femur fracture ORIF.  No adverse findings.   Electronically Signed   By: Tiburcio PeaJonathan  Watts M.D.   On: 12/29/2013 05:05   Dg C-arm 1-60 Min  12/29/2013   CLINICAL DATA:  Intra medullary nail fixation  EXAM: RIGHT FEMUR - 2 VIEW; DG C-ARM 61-120 MIN  COMPARISON:  12/28/2013 femur radiography  FINDINGS: Fluoroscopy shows placement of a intra medullary nail for fixation of a subtrochanteric femur fracture. Fracture alignment is excellent. No adverse hardware findings.  IMPRESSION: Subtrochanteric femur fracture status post ORIF. No adverse findings.   Electronically Signed   By: Tiburcio PeaJonathan  Watts M.D.   On: 12/29/2013 04:48     EKG Interpretation   Date/Time:  Wednesday December 28 2013 14:12:00 EST Ventricular Rate:  61 PR Interval:  215 QRS Duration: 89 QT Interval:  431 QTC Calculation: 434 R Axis:   72 Text Interpretation:  Sinus rhythm Borderline prolonged PR interval  Probable anteroseptal infarct, old Minimal ST depression, inferior leads  No previous ECGs available Confirmed by [facility]  MD, Jonny RuizJOHN ([id number]) on  12/28/2013 2:15:36 PM      MDM   Final diagnoses:  Fracture, proximal femur, right, closed, initial encounter    The patient appears reasonably stabilized for admission considering the current resources, flow, and capabilities available in the ED  at this time, and I doubt any other [facility] requiring further screening and/or treatment in the ED prior to admission.    Hurman HornJohn M Eyal Greenhaw, MD 12/29/13 [phone]

## 2013-12-28 NOTE — ED Notes (Signed)
EMS administered Fentanyl 100 mcg, Zofran 4mg 

## 2013-12-28 NOTE — Consult Note (Addendum)
ORTHOPAEDIC CONSULTATION  REQUESTING PHYSICIAN: Marinda ElkAbraham Feliz Ortiz, MD  Chief Complaint: Right leg pain  HPI: Molly [name] is a 78 y.o. female who complains of  acute severe right leg pain after a mechanical fall today. There was no loss of consciousness. She denies any other injuries. 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 Pain is better with rest, worse with movement. She is currently comfortable in a Hare traction splint, which I offered to remove, but she has indicated that she was to keep it in place until surgery.  Past Medical History  Diagnosis Date  . Hypertension    History reviewed. No pertinent past surgical history. History   Social History  . Marital Status: Married    Spouse Name: N/A    Number of Children: N/A  . Years of Education: N/A   Social History Main Topics  . Smoking status: Never Smoker   . Smokeless tobacco: None  . Alcohol Use: No  . Drug Use: No  . Sexual Activity: No   Other Topics Concern  . None   Social History Narrative  . None   Family History  Problem Relation Age of Onset  . Lung cancer Father    Allergies  Allergen Reactions  . Penicillins Diarrhea, Itching and Swelling   Prior to Admission medications   Medication Sig Start Date End Date Taking? Authorizing Provider  amLODipine-benazepril (LOTREL) 10-20 MG per capsule Take 1 capsule by mouth daily. 12/07/13  Yes Historical Provider, MD  CALCIUM PO Take 1 tablet by mouth daily.    Yes Historical Provider, MD  hydrochlorothiazide (HYDRODIURIL) 25 MG tablet Take 25 mg by mouth daily. 10/07/13  Yes Historical Provider, MD  labetalol (NORMODYNE) 300 MG tablet Take 300 mg by mouth 2 (two) times daily. 12/28/13  Yes Historical Provider, MD  levothyroxine (SYNTHROID, LEVOTHROID) 88 MCG tablet Take 88 mcg by mouth daily. 12/13/13  Yes Historical Provider, MD  Multiple Vitamin (MULTIVITAMIN WITH MINERALS) TABS tablet Take 1  tablet by mouth daily.   Yes Historical Provider, MD  potassium chloride (K-DUR,KLOR-CON) 10 MEQ tablet Take 10 mEq by mouth daily. 12/22/13  Yes Historical Provider, MD   Dg Chest 1 View  12/28/2013   CLINICAL DATA:  Fall, hip injury  EXAM: CHEST - 1 VIEW  COMPARISON:  None.  FINDINGS: Cardiac silhouette is enlarged. The aorta is ectatic. Central venous congestion. No overt pulmonary edema. No pleural fluid or pneumothorax.  IMPRESSION: Central venous congestion.  No overt pulmonary edema.   Electronically Signed   By: Genevive BiStewart  Edmunds M.D.   On: 12/28/2013 16:37   Dg Pelvis 1-2 Views  12/28/2013   CLINICAL DATA:  Fall.  Pelvic pain.  Initial encounter.  EXAM: PELVIS - 1-2 VIEW  COMPARISON:  None.  FINDINGS: There is no evidence of pelvic fracture or diastasis. No pelvic bone lesions are seen. Proximal right femoral shaft fracture demonstrated.  IMPRESSION: No evidence of pelvic fracture. Proximal right femur fracture noted.   Electronically Signed   By: Myles RosenthalJohn  Stahl M.D.   On: 12/28/2013 16:36   Dg Femur Right  12/28/2013   CLINICAL DATA:  Status post fall today with a right femur fracture. Initial encounter.  EXAM: RIGHT FEMUR - 2 VIEW  COMPARISON:  None.  FINDINGS: The patient is in traction. The patient has a subtrochanteric right femur fracture with 1 shaft with medial displacement. The fracture has a spiral configuration and  is centered 11 cm below the top of the greater trochanter. No other acute bony or joint abnormality is identified.  IMPRESSION: Acute subtrochanteric right femur fracture as described.   Electronically Signed   By: Drusilla Kannerhomas  Dalessio M.D.   On: 12/28/2013 16:37    Positive ROS: All other systems have been reviewed and were otherwise negative with the exception of those mentioned in the HPI and as above.  Physical Exam: General: Alert, no acute distress Cardiovascular: No pedal edema Respiratory: No cyanosis, no use of accessory musculature GI: No organomegaly, abdomen  is soft and non-tender Skin: No lesions in the area of chief complaint Neurologic: Sensation intact distally Psychiatric: Patient is competent for consent with normal mood and affect Lymphatic: No axillary or cervical lymphadenopathy  MUSCULOSKELETAL: Right hip has positive pain to palpation with soft tissue swelling. There is no evidence for open fracture. The skin is intact and does not appear tented. EHL and FHL are intact.  Assessment: Right subtrochanteric femur fracture, acute  Plan: This is an acute severe injury and carries risks for both morbidity and mortality. I recommended surgical intervention in order optimize long-term ambulatory function. She was previously very high functioning, and we are going to plan to make sure that she is optimized, and then probably proceed with surgical intervention later tonight.  The risks benefits and alternatives were discussed with the patient including but not limited to the risks of nonoperative treatment, versus surgical intervention including infection, bleeding, nerve injury, malunion, nonunion, the need for revision surgery, hardware prominence, hardware failure, the need for hardware removal, blood clots, cardiopulmonary complications, morbidity, mortality, among others, and they were willing to proceed.     Eulas PostLANDAU,Shoshana Johal P, MD Cell [phone]   12/28/2013 6:46 PM

## 2013-12-28 NOTE — ED Notes (Signed)
Pt here from home by ems for glf, pt has hip/femur deformity to right leg,

## 2013-12-28 NOTE — Discharge Instructions (Signed)
Diet: As you were doing prior to hospitalization  ° °Shower:  May shower but keep the wounds dry, use an occlusive plastic wrap, NO SOAKING IN TUB.  If the bandage gets wet, change with a clean dry gauze. ° °Dressing:  You may change your dressing 3-5 days after surgery.  Then change the dressing daily with sterile gauze dressing.   ° °There are sticky tapes (steri-strips) on your wounds and all the stitches are absorbable.  Leave the steri-strips in place when changing your dressings, they will peel off with time, usually 2-3 weeks. ° °Activity:  Increase activity slowly as tolerated, but follow the weight bearing instructions below.  No lifting or driving for 6 weeks. ° °Weight Bearing:   As tolerated.   ° °To prevent constipation: you may use a stool softener such as - ° °Colace (over the counter) 100 mg by mouth twice a day  °Drink plenty of fluids (prune juice may be helpful) and high fiber foods °Miralax (over the counter) for constipation as needed.   ° °Itching:  If you experience itching with your medications, try taking only a single pain pill, or even half a pain pill at a time.  You may take up to 10 pain pills per day, and you can also use benadryl over the counter for itching or also to help with sleep.  ° °Precautions:  If you experience chest pain or shortness of breath - call 911 immediately for transfer to the hospital emergency department!! ° °If you develop a fever greater that 101 F, purulent drainage from wound, increased redness or drainage from wound, or calf pain -- Call the office at [phone]                                                °Follow- Up Appointment:  Please call for an appointment to be seen in 2 weeks [place] - [phone] ° ° ° ° ° °

## 2013-12-28 NOTE — ED Notes (Signed)
[name]- [phone]

## 2013-12-29 ENCOUNTER — Encounter (HOSPITAL_COMMUNITY): Payer: Self-pay | Admitting: General Practice

## 2013-12-29 DIAGNOSIS — E039 Hypothyroidism, unspecified: Secondary | ICD-10-CM

## 2013-12-29 DIAGNOSIS — S7221XD Displaced subtrochanteric fracture of right femur, subsequent encounter for closed fracture with routine healing: Secondary | ICD-10-CM

## 2013-12-29 LAB — CBC
HCT: 27.5 % — ABNORMAL LOW (ref 36.0–46.0)
HEMATOCRIT: 27.2 % — AB (ref 36.0–46.0)
HEMOGLOBIN: 9.1 g/dL — AB (ref 12.0–15.0)
Hemoglobin: 9 g/dL — ABNORMAL LOW (ref 12.0–15.0)
MCH: 29.3 pg (ref 26.0–34.0)
MCH: 30 pg (ref 26.0–34.0)
MCHC: 32.7 g/dL (ref 30.0–36.0)
MCHC: 33.5 g/dL (ref 30.0–36.0)
MCV: 89.6 fL (ref 78.0–100.0)
MCV: 89.8 fL (ref 78.0–100.0)
Platelets: 184 10*3/uL (ref 150–400)
Platelets: 191 10*3/uL (ref 150–400)
RBC: 3.03 MIL/uL — ABNORMAL LOW (ref 3.87–5.11)
RBC: 3.07 MIL/uL — AB (ref 3.87–5.11)
RDW: 13.6 % (ref 11.5–15.5)
RDW: 13.6 % (ref 11.5–15.5)
WBC: 11.8 10*3/uL — ABNORMAL HIGH (ref 4.0–10.5)
WBC: 12.1 10*3/uL — ABNORMAL HIGH (ref 4.0–10.5)

## 2013-12-29 LAB — CREATININE, SERUM
CREATININE: 0.61 mg/dL (ref 0.50–1.10)
GFR calc Af Amer: >90 mL/min (ref >90)
GFR, EST NON AFRICAN AMERICAN: 86 mL/min — AB (ref >90)

## 2013-12-29 LAB — BASIC METABOLIC PANEL
Anion gap: 11 (ref 5–15)
BUN: 20 mg/dL (ref 6–23)
CO2: 28 mEq/L (ref 19–32)
Calcium: 8.1 mg/dL — ABNORMAL LOW (ref 8.4–10.5)
Chloride: 99 mEq/L (ref 96–112)
Creatinine, Ser: 0.61 mg/dL (ref 0.50–1.10)
GFR calc Af Amer: >90 mL/min (ref >90)
GFR, EST NON AFRICAN AMERICAN: 86 mL/min — AB (ref >90)
GLUCOSE: 153 mg/dL — AB (ref 70–99)
POTASSIUM: 3.3 meq/L — AB (ref 3.7–5.3)
Sodium: 138 mEq/L (ref 137–147)

## 2013-12-29 LAB — VITAMIN D 25 HYDROXY (VIT D DEFICIENCY, FRACTURES): Vit D, 25-Hydroxy: 48 ng/mL (ref 30–100)

## 2013-12-29 MED ORDER — POLYETHYLENE GLYCOL 3350 17 G PO PACK
17.0000 g | PACK | Freq: Every day | ORAL | Status: DC | PRN
  Administered 2013-12-30: 17 g via ORAL
  Filled 2013-12-29: qty 1

## 2013-12-29 MED ORDER — ACETAMINOPHEN 325 MG PO TABS: 650.0000 mg | ORAL_TABLET | Freq: Four times a day (QID) | ORAL | Status: DC | PRN

## 2013-12-29 MED ORDER — ONDANSETRON HCL 4 MG/2ML IJ SOLN: 4.0000 mg | Freq: Four times a day (QID) | INTRAMUSCULAR | Status: DC | PRN

## 2013-12-29 MED ORDER — MENTHOL 3 MG MT LOZG
1.0000 | LOZENGE | OROMUCOSAL | Status: DC | PRN
  Filled 2013-12-29: qty 9

## 2013-12-29 MED ORDER — POTASSIUM CHLORIDE IN NACL 20-0.45 MEQ/L-% IV SOLN
INTRAVENOUS | Status: DC
  Administered 2013-12-29 – 2013-12-30 (×2): via INTRAVENOUS
  Filled 2013-12-29 (×5): qty 1000

## 2013-12-29 MED ORDER — HYDROCODONE-ACETAMINOPHEN 5-325 MG PO TABS
1.0000 | ORAL_TABLET | Freq: Four times a day (QID) | ORAL | Status: DC | PRN
  Administered 2013-12-29 – 2014-01-01 (×10): 2 via ORAL
  Administered 2014-01-02 (×2): 1 via ORAL
  Filled 2013-12-29: qty 1
  Filled 2013-12-29 (×3): qty 2
  Filled 2013-12-29: qty 1
  Filled 2013-12-29 (×7): qty 2

## 2013-12-29 MED ORDER — DOCUSATE SODIUM 100 MG PO CAPS
100.0000 mg | ORAL_CAPSULE | Freq: Two times a day (BID) | ORAL | Status: DC
  Administered 2013-12-29 – 2013-12-30 (×4): 100 mg via ORAL
  Filled 2013-12-29 (×6): qty 1

## 2013-12-29 MED ORDER — SODIUM CHLORIDE 0.9 % IV BOLUS (SEPSIS)
250.0000 mL | Freq: Once | INTRAVENOUS | Status: AC
  Administered 2013-12-29: 250 mL via INTRAVENOUS

## 2013-12-29 MED ORDER — CEFAZOLIN SODIUM-DEXTROSE 2-3 GM-% IV SOLR
2.0000 g | Freq: Four times a day (QID) | INTRAVENOUS | Status: AC
  Administered 2013-12-29 (×2): 2 g via INTRAVENOUS
  Filled 2013-12-29 (×2): qty 50

## 2013-12-29 MED ORDER — ENOXAPARIN SODIUM 40 MG/0.4ML ~~LOC~~ SOLN
40.0000 mg | SUBCUTANEOUS | Status: DC
  Administered 2013-12-30 – 2014-01-01 (×3): 40 mg via SUBCUTANEOUS
  Filled 2013-12-29 (×5): qty 0.4

## 2013-12-29 MED ORDER — PHENOL 1.4 % MT LIQD: 1.0000 | OROMUCOSAL | Status: DC | PRN

## 2013-12-29 MED ORDER — FENTANYL CITRATE 0.05 MG/ML IJ SOLN
INTRAMUSCULAR | Status: AC
  Filled 2013-12-29: qty 2

## 2013-12-29 MED ORDER — SENNA 8.6 MG PO TABS
1.0000 | ORAL_TABLET | Freq: Two times a day (BID) | ORAL | Status: DC
  Administered 2013-12-29 – 2014-01-01 (×8): 8.6 mg via ORAL
  Filled 2013-12-29 (×10): qty 1

## 2013-12-29 MED ORDER — ONDANSETRON HCL 4 MG PO TABS: 4.0000 mg | ORAL_TABLET | Freq: Four times a day (QID) | ORAL | Status: DC | PRN

## 2013-12-29 MED ORDER — MORPHINE SULFATE 2 MG/ML IJ SOLN
0.5000 mg | INTRAMUSCULAR | Status: DC | PRN
  Administered 2013-12-30: 0.5 mg via INTRAVENOUS
  Filled 2013-12-29: qty 1

## 2013-12-29 MED ORDER — ACETAMINOPHEN 650 MG RE SUPP: 650.0000 mg | Freq: Four times a day (QID) | RECTAL | Status: DC | PRN

## 2013-12-29 MED ORDER — ALUM & MAG HYDROXIDE-SIMETH 200-200-20 MG/5ML PO SUSP: 30.0000 mL | ORAL | Status: DC | PRN

## 2013-12-29 MED ORDER — METOCLOPRAMIDE HCL 5 MG/ML IJ SOLN: 5.0000 mg | Freq: Three times a day (TID) | INTRAMUSCULAR | Status: DC | PRN

## 2013-12-29 MED ORDER — POTASSIUM CHLORIDE CRYS ER 20 MEQ PO TBCR
40.0000 meq | EXTENDED_RELEASE_TABLET | Freq: Once | ORAL | Status: AC
  Administered 2013-12-29: 40 meq via ORAL
  Filled 2013-12-29: qty 2

## 2013-12-29 MED ORDER — METOCLOPRAMIDE HCL 5 MG PO TABS
5.0000 mg | ORAL_TABLET | Freq: Three times a day (TID) | ORAL | Status: DC | PRN
  Filled 2013-12-29: qty 2

## 2013-12-29 MED ORDER — BISACODYL 10 MG RE SUPP: 10.0000 mg | Freq: Every day | RECTAL | Status: DC | PRN

## 2013-12-29 MED ORDER — MAGNESIUM CITRATE PO SOLN: 1.0000 | Freq: Once | ORAL | Status: AC | PRN

## 2013-12-29 NOTE — Plan of Care (Signed)
Problem: Phase I Progression Outcomes Goal: Incision/dressings dry and intact Outcome: Completed/Met Date Met:  12/29/13 Goal: Tubes/drains patent Outcome: Not Applicable Date Met:  12/29/13 Goal: Voiding-avoid urinary catheter unless indicated Outcome: Completed/Met Date Met:  12/29/13  Problem: Phase II Progression Outcomes Goal: Progressing with IS, TCDB Outcome: Completed/Met Date Met:  12/29/13     

## 2013-12-29 NOTE — Anesthesia Postprocedure Evaluation (Signed)
  Anesthesia Post-op Note  Patient: Molly [name]  Procedure(s) Performed: Procedure(s): INTRAMEDULLARY (IM) NAIL FEMORAL (Right)  Patient Location: PACU  Anesthesia Type: General   Level of Consciousness: awake, alert  and oriented  Airway and Oxygen Therapy: Patient Spontanous Breathing  Post-op Pain: mild  Post-op Assessment: Post-op Vital signs reviewed  Post-op Vital Signs: Reviewed  Last Vitals:  Filed Vitals:   12/29/13 0015  BP:   Pulse: 66  Temp:   Resp: 12    Complications: No apparent anesthesia complications

## 2013-12-29 NOTE — Progress Notes (Signed)
Patient ID: Molly [name]  female  [id number]    DOB: 09/11/1935    DOA: 12/28/2013  PCP: Lillia MountainGRIFFIN,JOHN JOSEPH, MD  Brief history of present illness  The patient is a 78 year old female with hypertension, lives with her family at home, presented with severe right hip pain and thigh pain after a mechanical fall. Denied any syncopal episode. X-rays of the right hip showed acute subtrochanteric right femur fracture. Orthopedics was consulted, patient underwent right intramedullary nail surgery on 11/25.  Assessment/Plan: Principal Problem:   Subtrochanteric fracture of right femur - Status post right intramedullary nail surgery, postop day #1 - Continue pain control, Lovenox - Management per orthopedics - PTOT evaluation, does not want outpatient rehabilitation, states she will do okay with home PT, not out of bed yet  Active Problems:   Essential hypertension - Stable, continue Lotensin, Norvasc, labetalol    Hypokalemia Replaced orally   Hypothyroidism - Continue Synthroid  DVT Prophylaxis:Lovenox   Code Status:Full code   Family Communication:  Disposition:Prefers home PT   Consultants:  Orthopedics   Procedures:  IM nailing   Antibiotics:  Keflex perioperatively    Subjective:  patient seen and examined, denies any specific complaints, pain controlled, no fevers or chills, hasn't gotten out of bed yet   Objective: Weight change:   Intake/Output Summary (Last 24 hours) at 12/29/13 0934 Last data filed at 12/29/13 0832  Gross per 24 hour  Intake   1850 ml  Output    200 ml  Net   1650 ml   Blood pressure 123/56, pulse 88, temperature 98.8 F (37.1 C), temperature source Oral, resp. rate 16, height 5\' 2"  (1.575 m), weight 63.504 kg (140 lb), SpO2 91 %.  Physical Exam: General: Alert and awake, oriented x3, not in any acute distress. CVS: S1-S2 clear, no murmur rubs or gallops Chest: clear to auscultation bilaterally, no wheezing, rales or  rhonchi Abdomen: soft nontender, nondistended, normal bowel sounds  Extremities: no cyanosis, clubbing or edema noted bilaterally Neuro: Cranial nerves II-XII intact, no focal neurological deficits  Lab Results: Basic Metabolic Panel:  Recent Labs Lab 12/28/13 1455 12/29/13 0340  NA 137 138  K 3.4* 3.3*  CL 98 99  CO2 26 28  GLUCOSE 119* 153*  BUN 25* 20  CREATININE 0.83 0.61  0.61  CALCIUM 9.0 8.1*   Liver Function Tests: No results for input(s): AST, ALT, ALKPHOS, BILITOT, PROT, ALBUMIN in the last 168 hours. No results for input(s): LIPASE, AMYLASE in the last 168 hours. No results for input(s): AMMONIA in the last 168 hours. CBC:  Recent Labs Lab 12/28/13 1455 12/29/13 0340  WBC 8.4 12.1*  11.8*  NEUTROABS 6.3  --   HGB 11.6* 9.1*  9.0*  HCT 35.1* 27.2*  27.5*  MCV 87.3 89.8  89.6  PLT 206 191  184   Cardiac Enzymes: No results for input(s): CKTOTAL, CKMB, CKMBINDEX, TROPONINI in the last 168 hours. BNP: Invalid input(s): POCBNP CBG: No results for input(s): GLUCAP in the last 168 hours.   Micro Results: Recent Results (from the past 240 hour(s))  MRSA PCR Screening     Status: None   Collection Time: 12/28/13  8:41 PM  Result Value Ref Range Status   MRSA by PCR NEGATIVE NEGATIVE Final    Comment:        The GeneXpert MRSA Assay (FDA approved for NASAL specimens only), is one component of a comprehensive MRSA colonization surveillance program. It is not intended to diagnose MRSA  infection nor to guide or monitor treatment for MRSA infections.     Studies/Results: Dg Chest 1 View  12/28/2013   CLINICAL DATA:  Fall, hip injury  EXAM: CHEST - 1 VIEW  COMPARISON:  None.  FINDINGS: Cardiac silhouette is enlarged. The aorta is ectatic. Central venous congestion. No overt pulmonary edema. No pleural fluid or pneumothorax.  IMPRESSION: Central venous congestion.  No overt pulmonary edema.   Electronically Signed   By: Genevive BiStewart  Edmunds M.D.   On:  12/28/2013 16:37   Dg Pelvis 1-2 Views  12/28/2013   CLINICAL DATA:  Fall.  Pelvic pain.  Initial encounter.  EXAM: PELVIS - 1-2 VIEW  COMPARISON:  None.  FINDINGS: There is no evidence of pelvic fracture or diastasis. No pelvic bone lesions are seen. Proximal right femoral shaft fracture demonstrated.  IMPRESSION: No evidence of pelvic fracture. Proximal right femur fracture noted.   Electronically Signed   By: Myles RosenthalJohn  Stahl M.D.   On: 12/28/2013 16:36   Dg Femur Right  12/29/2013   CLINICAL DATA:  Intra medullary nail fixation  EXAM: RIGHT FEMUR - 2 VIEW; DG C-ARM 61-120 MIN  COMPARISON:  12/28/2013 femur radiography  FINDINGS: Fluoroscopy shows placement of a intra medullary nail for fixation of a subtrochanteric femur fracture. Fracture alignment is excellent. No adverse hardware findings.  IMPRESSION: Subtrochanteric femur fracture status post ORIF. No adverse findings.   Electronically Signed   By: Tiburcio PeaJonathan  Watts M.D.   On: 12/29/2013 04:48   Dg Femur Right  12/28/2013   CLINICAL DATA:  Status post fall today with a right femur fracture. Initial encounter.  EXAM: RIGHT FEMUR - 2 VIEW  COMPARISON:  None.  FINDINGS: The patient is in traction. The patient has a subtrochanteric right femur fracture with 1 shaft with medial displacement. The fracture has a spiral configuration and is centered 11 cm below the top of the greater trochanter. No other acute bony or joint abnormality is identified.  IMPRESSION: Acute subtrochanteric right femur fracture as described.   Electronically Signed   By: Drusilla Kannerhomas  Dalessio M.D.   On: 12/28/2013 16:37   Pelvis Portable  12/29/2013   CLINICAL DATA:  Postop right hip.  EXAM: PORTABLE PELVIS 1-2 VIEWS  COMPARISON:  12/28/2013  FINDINGS: Subtrochanteric right femur fracture status post ORIF. No new osseous abnormality. Fracture alignment is excellent. The imaged pelvic ring is intact. The left hip is intact.  IMPRESSION: Subtrochanteric right femur fracture ORIF.  No  adverse findings.   Electronically Signed   By: Tiburcio PeaJonathan  Watts M.D.   On: 12/29/2013 05:03   Hip Portable 1 View Right  12/29/2013   CLINICAL DATA:  Postop right hip fracture  EXAM: PORTABLE RIGHT HIP - 1 VIEW  COMPARISON:  12/28/2013  FINDINGS: Lateral image of the right hip shows changes of subtrochanteric femur fracture ORIF using an intra medullary nail. In the lateral projection the fracture alignment is excellent.  IMPRESSION: Subtrochanteric right femur fracture ORIF.  No adverse findings.   Electronically Signed   By: Tiburcio PeaJonathan  Watts M.D.   On: 12/29/2013 05:05   Dg C-arm 1-60 Min  12/29/2013   CLINICAL DATA:  Intra medullary nail fixation  EXAM: RIGHT FEMUR - 2 VIEW; DG C-ARM 61-120 MIN  COMPARISON:  12/28/2013 femur radiography  FINDINGS: Fluoroscopy shows placement of a intra medullary nail for fixation of a subtrochanteric femur fracture. Fracture alignment is excellent. No adverse hardware findings.  IMPRESSION: Subtrochanteric femur fracture status post ORIF. No adverse findings.   Electronically  Signed   By: Tiburcio Pea M.D.   On: 12/29/2013 04:48    Medications: Scheduled Meds: . amLODipine  10 mg Oral Daily  . benazepril  20 mg Oral Daily  .  ceFAZolin (ANCEF) IV  2 g Intravenous Q6H  . docusate sodium  100 mg Oral BID  . enoxaparin (LOVENOX) injection  40 mg Subcutaneous Q24H  . fentaNYL      . labetalol  300 mg Oral BID  . levothyroxine  88 mcg Oral QAC breakfast  . potassium chloride  10 mEq Oral Daily  . senna  1 tablet Oral BID      LOS: 1 day   Ladoris Lythgoe M.D. [facility] 12/29/2013, 9:34 AM Pager: [phone]  If 7PM-7AM, please contact night-coverage [email or web address] Password [email or web address]

## 2013-12-29 NOTE — Progress Notes (Signed)
[facility] Progress Note  Subjective  Doing well Pain tolerable Ate some breakfast   Noted that proximal bandage is saturated  Has not been out of bed yet   Review of Systems  Constitutional: Negative for fever and chills.  HENT: Negative for hearing loss.   Respiratory: Negative for shortness of breath and wheezing.   Cardiovascular: Negative for chest pain and palpitations.  Gastrointestinal: Negative for nausea, vomiting and abdominal pain.  Neurological: Negative for tingling, sensory change and headaches.     Objective   BP 123/56 mmHg  Pulse 88  Temp(Src) 98.8 F (37.1 C) (Oral)  Resp 16  Ht 5\' 2"  (1.575 m)  Wt 63.504 kg (140 lb)  BMI 25.60 kg/m2  SpO2 91%  Intake/Output      11/25 0701 - 11/26 0700 11/26 0701 - 11/27 0700   P.O. 220 240   I.V. (mL/kg) 1340 (21.1)    IV Piggyback 50    Total Intake(mL/kg) 1610 (25.4) 240 (3.8)   Urine (mL/kg/hr) 200    Total Output 200     Net +1410 +240        Urine Occurrence 1 x      Labs  Results for Hulan FessHANOS, Molly A (MRN [id number]) as of 12/29/2013 09:08  Ref. Range 12/29/2013 03:40  WBC Latest Range: 4.0-10.5 K/uL 12.1 (H)  RBC Latest Range: 3.87-5.11 MIL/uL 3.03 (L)  Hemoglobin Latest Range: 12.0-15.0 g/dL 9.1 (L)  HCT Latest Range: 36.0-46.0 % 27.2 (L)  MCV Latest Range: 78.0-100.0 fL 89.8  MCH Latest Range: 26.0-34.0 pg 30.0  MCHC Latest Range: 30.0-36.0 g/dL 28.433.5  RDW Latest Range: 11.5-15.5 % 13.6  Platelets Latest Range: 150-400 K/uL 191    Exam  Gen: awake and alert, resting comfortably, nad Lungs: clear Cardiac: RRR Abd: soft, NTND, + BS  Ext:       Right Lower Extremity   Proximal dressing saturated   Dressing changed by myself, wound stable, bloody drainage, steri-strips intact   Ext warm  Other dressings stable  Motor and sensory functions grossly intact  + DP pulse   No dct   Assessment and Plan   POD/HD#: 1  78 y/o female s/p fall with R subtroch femur  fracture  1. Fall  2. R subtroch femur fracture s/p IMN   WBAT  ROM as tolerated  PT/OT evals  Ice prn     3. Medical issues   Per primary service  4. DVT/PE prophylaxis  Lovenox x 3 weeks post op   5. Pain management   norco   Morphine   6. FEN  Advance diet as toelrated  7. Dispo  PT/OT evals  F/u cbc in am   Pt really wants to dc home with HHPT will need to see if she is a candidate for such     Mearl LatinKeith W. Yaire Kreher, PA-C [facility] [phone](P) [phone] (O) 12/29/2013 9:07 AM

## 2013-12-29 NOTE — Plan of Care (Signed)
Problem: Phase I Progression Outcomes Goal: Pain controlled with appropriate interventions Outcome: Completed/Met Date Met:  12/29/13 Goal: Initial discharge plan identified Outcome: Completed/Met Date Met:  12/29/13  Problem: Phase II Progression Outcomes Goal: Pain controlled Outcome: Completed/Met Date Met:  12/29/13 Goal: Surgical site without signs of infection Outcome: Completed/Met Date Met:  12/29/13 Goal: Dressings dry/intact Outcome: Completed/Met Date Met:  12/29/13 Goal: Sutures/staples intact Outcome: Completed/Met Date Met:  12/29/13 Goal: Foley discontinued Outcome: Not Applicable Date Met:  67/67/20

## 2013-12-30 DIAGNOSIS — D649 Anemia, unspecified: Secondary | ICD-10-CM

## 2013-12-30 LAB — BASIC METABOLIC PANEL
Anion gap: 9 (ref 5–15)
BUN: 13 mg/dL (ref 6–23)
CO2: 27 meq/L (ref 19–32)
Calcium: 7.7 mg/dL — ABNORMAL LOW (ref 8.4–10.5)
Chloride: 100 mEq/L (ref 96–112)
Creatinine, Ser: 0.56 mg/dL (ref 0.50–1.10)
GFR calc Af Amer: >90 mL/min (ref >90)
GFR calc non Af Amer: 88 mL/min — ABNORMAL LOW (ref >90)
Glucose, Bld: 106 mg/dL — ABNORMAL HIGH (ref 70–99)
Potassium: 3.7 mEq/L (ref 3.7–5.3)
Sodium: 136 mEq/L — ABNORMAL LOW (ref 137–147)

## 2013-12-30 LAB — CBC
HCT: 22.2 % — ABNORMAL LOW (ref 36.0–46.0)
Hemoglobin: 7.4 g/dL — ABNORMAL LOW (ref 12.0–15.0)
MCH: 30 pg (ref 26.0–34.0)
MCHC: 33.3 g/dL (ref 30.0–36.0)
MCV: 89.9 fL (ref 78.0–100.0)
Platelets: 142 10*3/uL — ABNORMAL LOW (ref 150–400)
RBC: 2.47 MIL/uL — AB (ref 3.87–5.11)
RDW: 13.8 % (ref 11.5–15.5)
WBC: 6.7 10*3/uL (ref 4.0–10.5)

## 2013-12-30 LAB — PREPARE RBC (CROSSMATCH)

## 2013-12-30 MED ORDER — SODIUM CHLORIDE 0.9 % IV SOLN
Freq: Once | INTRAVENOUS | Status: AC
  Administered 2013-12-30: 17:00:00 via INTRAVENOUS

## 2013-12-30 MED ORDER — MORPHINE SULFATE 2 MG/ML IJ SOLN: 0.5000 mg | INTRAMUSCULAR | Status: DC | PRN

## 2013-12-30 MED ORDER — FUROSEMIDE 10 MG/ML IJ SOLN
20.0000 mg | Freq: Once | INTRAMUSCULAR | Status: AC
  Administered 2013-12-30: 20 mg via INTRAVENOUS

## 2013-12-30 MED ORDER — FUROSEMIDE 10 MG/ML IJ SOLN
20.0000 mg | Freq: Once | INTRAMUSCULAR | Status: AC
  Administered 2013-12-31: 20 mg via INTRAVENOUS
  Filled 2013-12-30: qty 2

## 2013-12-30 MED ORDER — DIPHENHYDRAMINE HCL 25 MG PO CAPS
25.0000 mg | ORAL_CAPSULE | Freq: Once | ORAL | Status: AC
  Administered 2013-12-30: 25 mg via ORAL
  Filled 2013-12-30: qty 1

## 2013-12-30 MED ORDER — METHOCARBAMOL 500 MG PO TABS
500.0000 mg | ORAL_TABLET | Freq: Four times a day (QID) | ORAL | Status: DC | PRN
  Administered 2013-12-30 – 2013-12-31 (×2): 500 mg via ORAL
  Filled 2013-12-30 (×3): qty 1

## 2013-12-30 MED ORDER — OXYCODONE HCL 5 MG PO TABS: 5.0000 mg | ORAL_TABLET | ORAL | Status: DC | PRN

## 2013-12-30 MED ORDER — ACETAMINOPHEN 325 MG PO TABS
650.0000 mg | ORAL_TABLET | Freq: Once | ORAL | Status: AC
  Administered 2013-12-30: 650 mg via ORAL
  Filled 2013-12-30: qty 2

## 2013-12-30 MED ORDER — SODIUM CHLORIDE 0.9 % IV SOLN
Freq: Once | INTRAVENOUS | Status: AC
  Administered 2013-12-30: 10:00:00 via INTRAVENOUS

## 2013-12-30 NOTE — Progress Notes (Signed)
Physical Therapy Treatment Patient Details Name: Molly FessMary A [name] MRN: [id number] DOB: 09/11/1935 Today's Date: 12/30/2013    History of Present Illness Fall now s/p IM nail of femur. PMHx:HTN    PT Comments    Pt. Denied dizziness while up with PT but was overall weak and did not tolerate much activity this pm.  Note pt. To receive 2 units of blood.  Pt. Was unable to walk into bathroom then no longer felt she needed to urinate once back to bed.  Son [name] present and asking questions regarding pt's course.    Follow Up Recommendations  SNF;Supervision/Assistance - 24 hour     Equipment Recommendations  Rolling walker with 5" wheels    Recommendations for Other Services       Precautions / Restrictions Precautions Precautions: Fall Restrictions Weight Bearing Restrictions: Yes RLE Weight Bearing: Weight bearing as tolerated    Mobility  Bed Mobility Overal bed mobility: Needs Assistance;+2 for physical assistance Bed Mobility: Sit to Supine     Supine to sit: Mod assist;+2 for physical assistance;HOB elevated Sit to supine: +2 for physical assistance;Mod assist   General bed mobility comments: Mod assist of 2 needed to control descent and to bring LEs up onto bed  Transfers Overall transfer level: Needs assistance Equipment used: Rolling walker (2 wheeled) Transfers: Sit to/from Stand Sit to Stand: +2 physical assistance;Mod assist         General transfer comment: +2 mod assist to rise to stand and to control descent to recliner then bed  Ambulation/Gait Ambulation/Gait assistance: Mod assist;+2 physical assistance Ambulation Distance (Feet): 5 Feet Assistive device: Rolling walker (2 wheeled) Gait Pattern/deviations: Step-to pattern;Decreased stride length;Trunk flexed;Antalgic Gait velocity: decreased   General Gait Details: Pt. able to make several steps before fatiguing.  Pt. was assisted to reclienr chair then pivoted to bed    Stairs             Wheelchair Mobility    Modified Rankin (Stroke Patients Only)       Balance Overall balance assessment: Needs assistance Sitting-balance support: Bilateral upper extremity supported;Feet supported Sitting balance-Leahy Scale: Fair     Standing balance support: Bilateral upper extremity supported;During functional activity Standing balance-Leahy Scale: Poor Standing balance comment: needed external and RW support for upright stance                    Cognition Arousal/Alertness: Awake/alert Behavior During Therapy: WFL for tasks assessed/performed Overall Cognitive Status: Within Functional Limits for tasks assessed                      Exercises General Exercises - Lower Extremity Ankle Circles/Pumps: AROM;Both;10 reps;Seated Quad Sets: AROM;Both;10 reps;Seated Long Arc Quad: AROM;Right;Left;10 reps;Seated    General Comments        Pertinent Vitals/Pain Pain Assessment: 0-10 Pain Score: 8  Pain Location: right upper thigh Pain Descriptors / Indicators: Aching Pain Intervention(s): Limited activity within patient's tolerance;Monitored during session;Patient requesting pain meds-RN notified;Ice applied;Repositioned    Home Living Family/patient expects to be discharged to:: Skilled nursing facility Living Arrangements: Spouse/significant other             Additional Comments: 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    Prior Function Level of Independence: Independent      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   PT Goals (current goals can now be found  in the care plan section) Acute Rehab PT Goals Patient Stated Goal: to rehab then home Progress towards PT goals: Progressing toward goals    Frequency  Min 6X/week    PT Plan Current plan remains appropriate    Co-evaluation PT/OT/SLP Co-Evaluation/Treatment: Yes Reason for Co-Treatment: For  patient/therapist safety PT goals addressed during session: Mobility/safety with mobility;Balance;Proper use of DME OT goals addressed during session: ADL's and self-care     End of Session Equipment Utilized During Treatment: Gait belt Activity Tolerance: Patient limited by fatigue;Patient limited by pain Patient left: in bed;with call bell/phone within reach;with family/visitor present     Time: [id number] PT Time Calculation (min) (ACUTE ONLY): 33 min  Charges:  $Gait Training: 8-22 mins $Therapeutic Activity: 8-22 mins                    G Codes:      Ferman HammingBlankenship, Amairany Schumpert B 12/30/2013, 3:43 PM Weldon PickingSusan Harmoni Lucus PT Acute Rehab Services [phone] Bee[place] [phone]

## 2013-12-30 NOTE — Plan of Care (Signed)
Problem: Phase I Progression Outcomes Goal: OOB as tolerated unless otherwise ordered Outcome: Progressing     

## 2013-12-30 NOTE — Progress Notes (Signed)
Patient ID: Molly [name]  female  ZOX:[id number]    DOB: 09/11/1935    DOA: 12/28/2013  PCP: Lillia MountainGRIFFIN,JOHN JOSEPH, MD  Brief history of present illness The patient is a 78 year old female with hypertension, lives with her family at home, presented with severe right hip pain and thigh pain after a mechanical fall. Denied any syncopal episode. X-rays of the right hip showed acute subtrochanteric right femur fracture. Orthopedics was consulted, patient underwent right intramedullary nail surgery on 11/25.  Assessment/Plan: Principal Problem:   Subtrochanteric fracture of right femur - Status post right intramedullary nail surgery, postop day #2 - Continue pain control, Lovenox - Management per orthopedics - PTOT evaluation still pending, has not been out of bed yet - Continue stool softeners  Active Problems:   Essential hypertension - Stable, continue Lotensin, Norvasc, labetalol    Hypokalemia- resolved  Hypothyroidism - Continue Synthroid  Anemia: Likely anemia due to acute blood loss postoperatively  - Transfuse 2 units packed RBCs today  DVT Prophylaxis:Lovenox   Code Status:Full code   Family Communication:  Disposition:   Consultants:  Orthopedics   Procedures:  IM nailing   Antibiotics:  Keflex perioperatively    Subjective:  patient seen and examined, wants to get up today, constipation, no BM since admission   Objective: Weight change:   Intake/Output Summary (Last 24 hours) at 12/30/13 0930 Last data filed at 12/30/13 0220  Gross per 24 hour  Intake   1575 ml  Output      0 ml  Net   1575 ml   Blood pressure 116/48, pulse 91, temperature 98.2 F (36.8 C), temperature source Oral, resp. rate 16, height 5\' 2"  (1.575 m), weight 63.504 kg (140 lb), SpO2 97 %.  Physical Exam: General: A x O x3 CVS: S1-S2 clear Chest: CTAB Abdomen: soft, NT, ND, NBS  Extremities: no c/c/e bilaterally   Lab Results: Basic Metabolic Panel:  Recent  Labs Lab 12/29/13 0340 12/30/13 0345  NA 138 136*  K 3.3* 3.7  CL 99 100  CO2 28 27  GLUCOSE 153* 106*  BUN 20 13  CREATININE 0.61  0.61 0.56  CALCIUM 8.1* 7.7*   Liver Function Tests: No results for input(s): AST, ALT, ALKPHOS, BILITOT, PROT, ALBUMIN in the last 168 hours. No results for input(s): LIPASE, AMYLASE in the last 168 hours. No results for input(s): AMMONIA in the last 168 hours. CBC:  Recent Labs Lab 12/28/13 1455 12/29/13 0340 12/30/13 0345  WBC 8.4 12.1*  11.8* 6.7  NEUTROABS 6.3  --   --   HGB 11.6* 9.1*  9.0* 7.4*  HCT 35.1* 27.2*  27.5* 22.2*  MCV 87.3 89.8  89.6 89.9  PLT 206 191  184 142*   Cardiac Enzymes: No results for input(s): CKTOTAL, CKMB, CKMBINDEX, TROPONINI in the last 168 hours. BNP: Invalid input(s): POCBNP CBG: No results for input(s): GLUCAP in the last 168 hours.   Micro Results: Recent Results (from the past 240 hour(s))  MRSA PCR Screening     Status: None   Collection Time: 12/28/13  8:41 PM  Result Value Ref Range Status   MRSA by PCR NEGATIVE NEGATIVE Final    Comment:        The GeneXpert MRSA Assay (FDA approved for NASAL specimens only), is one component of a comprehensive MRSA colonization surveillance program. It is not intended to diagnose MRSA infection nor to guide or monitor treatment for MRSA infections.     Studies/Results: Dg Chest 1 View  12/28/2013   CLINICAL DATA:  Fall, hip injury  EXAM: CHEST - 1 VIEW  COMPARISON:  None.  FINDINGS: Cardiac silhouette is enlarged. The aorta is ectatic. Central venous congestion. No overt pulmonary edema. No pleural fluid or pneumothorax.  IMPRESSION: Central venous congestion.  No overt pulmonary edema.   Electronically Signed   By: Genevive BiStewart  Edmunds M.D.   On: 12/28/2013 16:37   Dg Pelvis 1-2 Views  12/28/2013   CLINICAL DATA:  Fall.  Pelvic pain.  Initial encounter.  EXAM: PELVIS - 1-2 VIEW  COMPARISON:  None.  FINDINGS: There is no evidence of pelvic  fracture or diastasis. No pelvic bone lesions are seen. Proximal right femoral shaft fracture demonstrated.  IMPRESSION: No evidence of pelvic fracture. Proximal right femur fracture noted.   Electronically Signed   By: Myles RosenthalJohn  Stahl M.D.   On: 12/28/2013 16:36   Dg Femur Right  12/29/2013   CLINICAL DATA:  Intra medullary nail fixation  EXAM: RIGHT FEMUR - 2 VIEW; DG C-ARM 61-120 MIN  COMPARISON:  12/28/2013 femur radiography  FINDINGS: Fluoroscopy shows placement of a intra medullary nail for fixation of a subtrochanteric femur fracture. Fracture alignment is excellent. No adverse hardware findings.  IMPRESSION: Subtrochanteric femur fracture status post ORIF. No adverse findings.   Electronically Signed   By: Tiburcio PeaJonathan  Watts M.D.   On: 12/29/2013 04:48   Dg Femur Right  12/28/2013   CLINICAL DATA:  Status post fall today with a right femur fracture. Initial encounter.  EXAM: RIGHT FEMUR - 2 VIEW  COMPARISON:  None.  FINDINGS: The patient is in traction. The patient has a subtrochanteric right femur fracture with 1 shaft with medial displacement. The fracture has a spiral configuration and is centered 11 cm below the top of the greater trochanter. No other acute bony or joint abnormality is identified.  IMPRESSION: Acute subtrochanteric right femur fracture as described.   Electronically Signed   By: Drusilla Kannerhomas  Dalessio M.D.   On: 12/28/2013 16:37   Pelvis Portable  12/29/2013   CLINICAL DATA:  Postop right hip.  EXAM: PORTABLE PELVIS 1-2 VIEWS  COMPARISON:  12/28/2013  FINDINGS: Subtrochanteric right femur fracture status post ORIF. No new osseous abnormality. Fracture alignment is excellent. The imaged pelvic ring is intact. The left hip is intact.  IMPRESSION: Subtrochanteric right femur fracture ORIF.  No adverse findings.   Electronically Signed   By: Tiburcio PeaJonathan  Watts M.D.   On: 12/29/2013 05:03   Hip Portable 1 View Right  12/29/2013   CLINICAL DATA:  Postop right hip fracture  EXAM: PORTABLE RIGHT  HIP - 1 VIEW  COMPARISON:  12/28/2013  FINDINGS: Lateral image of the right hip shows changes of subtrochanteric femur fracture ORIF using an intra medullary nail. In the lateral projection the fracture alignment is excellent.  IMPRESSION: Subtrochanteric right femur fracture ORIF.  No adverse findings.   Electronically Signed   By: Tiburcio PeaJonathan  Watts M.D.   On: 12/29/2013 05:05   Dg C-arm 1-60 Min  12/29/2013   CLINICAL DATA:  Intra medullary nail fixation  EXAM: RIGHT FEMUR - 2 VIEW; DG C-ARM 61-120 MIN  COMPARISON:  12/28/2013 femur radiography  FINDINGS: Fluoroscopy shows placement of a intra medullary nail for fixation of a subtrochanteric femur fracture. Fracture alignment is excellent. No adverse hardware findings.  IMPRESSION: Subtrochanteric femur fracture status post ORIF. No adverse findings.   Electronically Signed   By: Tiburcio PeaJonathan  Watts M.D.   On: 12/29/2013 04:48    Medications: Scheduled Meds: .  sodium chloride   Intravenous Once  . acetaminophen  650 mg Oral Once  . amLODipine  10 mg Oral Daily  . benazepril  20 mg Oral Daily  . diphenhydrAMINE  25 mg Oral Once  . docusate sodium  100 mg Oral BID  . enoxaparin (LOVENOX) injection  40 mg Subcutaneous Q24H  . furosemide  20 mg Intravenous Once  . furosemide  20 mg Intravenous Once  . labetalol  300 mg Oral BID  . levothyroxine  88 mcg Oral QAC breakfast  . senna  1 tablet Oral BID      LOS: 2 days   Neco Kling M.D. [facility] 12/30/2013, 9:30 AM Pager: [phone]  If 7PM-7AM, please contact night-coverage [email or web address] Password [email or web address]

## 2013-12-30 NOTE — Care Management Note (Signed)
CARE MANAGEMENT NOTE 12/30/2013  Patient:  Molly [name],Molly [name]   Account Number:  [id number]  Date Initiated:  12/30/2013  Documentation initiated by:  Vance PeperBRADY,Julian Medina  Subjective/Objective Assessment:   78 yr old female admitted s/p fall, with [name] right femur fracture. Patient had [name] right femur IM Nailing.     Action/Plan:   Physical therapy has reccommended that patient will need shortterm rehab . Social worker has been notified.   Anticipated DC Date:  12/31/2013   Anticipated DC Plan:  SKILLED NURSING FACILITY  In-house referral  Clinical Social Worker      DC Planning Services  CM consult      [facility]   Choice offered to / List presented to:     DME arranged  NA        HH arranged  NA      Status of service:  Completed, signed off Medicare Important Message given?   (If response is "NO", the following Medicare IM given date fields will be blank) Date Medicare IM given:   Medicare IM given by:   Date Additional Medicare IM given:   Additional Medicare IM given by:    Discharge Disposition:  SKILLED NURSING FACILITY  Per UR Regulation:  Reviewed for med. necessity/level of care/duration of stay  If discussed at Long Length of Stay Meetings, dates discussed:    Comments:

## 2013-12-30 NOTE — Care Management (Signed)
Second IM given. 

## 2013-12-30 NOTE — Evaluation (Signed)
Occupational Therapy Evaluation and Discharge Patient Details Name: Molly [name] Stacy MRN: [id number] DOB: 09/11/1935 Today's Date: 12/30/2013    History of Present Illness Fall now s/p IM nail of femur. 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 She will benefit from continued OT at SNF to get to [name] Mod I  level, acute OT will sign off.    Follow Up Recommendations  SNF    Equipment Recommendations   (TBD at next venue)       Precautions / Restrictions Precautions Precautions: Fall Restrictions Weight Bearing Restrictions: No RLE Weight Bearing: Weight bearing as tolerated      Mobility Bed Mobility Overal bed mobility: Needs Assistance;+2 for physical assistance Bed Mobility: Supine to Sit     Supine to sit: Mod assist;+2 for physical assistance;HOB elevated        Transfers Overall transfer level: Needs assistance Equipment used: Rolling walker (2 wheeled) Transfers: Sit to/from Stand Sit to Stand: Mod assist;+2 physical assistance              Balance Overall balance assessment: Needs assistance Sitting-balance support: Feet supported;Bilateral upper extremity supported Sitting balance-Leahy Scale: Fair     Standing balance support: Bilateral upper extremity supported Standing balance-Leahy Scale: Poor                              ADL Overall ADL's : Needs assistance/impaired Eating/Feeding: Independent;Sitting   Grooming: Set up;Sitting   Upper Body Bathing: Set up;Sitting   Lower Body Bathing: Maximal assistance (with +2 mod [name] sit<>stand)   Upper Body Dressing : Set up;Sitting   Lower Body Dressing: Total assistance (with +2 mod [name] sit<>stand)   Toilet Transfer: Moderate  assistance;+2 for safety/equipment;Ambulation;RW (bed>8 feet to sit in recliner)   Toileting- Clothing Manipulation and Hygiene: Maximal assistance (with +2 mod [name] sit<>stand)                         Pertinent Vitals/Pain Pain Assessment: 0-10 Pain Score: 8  Pain Location: right upper thigh (swollen) Pain Descriptors / Indicators: Aching Pain Intervention(s): Monitored during session;Repositioned;Ice applied     Hand Dominance Right   Extremity/Trunk Assessment Upper Extremity Assessment Upper Extremity Assessment: Overall WFL for tasks assessed   Lower Extremity Assessment Lower Extremity Assessment: Defer to PT evaluation       Communication Communication Communication: No difficulties   Cognition Arousal/Alertness: Awake/alert Behavior During Therapy: WFL for tasks assessed/performed Overall Cognitive Status: Within Functional Limits for tasks assessed                                Home Living Family/patient expects to be discharged to:: Skilled nursing facility Living Arrangements: Spouse/significant other                                      Prior Functioning/Environment Level of Independence: Independent        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  OT Diagnosis: Generalized weakness;Acute pain   OT Problem List: Decreased strength;Decreased range of motion;Decreased activity tolerance;Impaired balance (sitting and/or standing);Pain;Decreased knowledge of precautions;Decreased knowledge of use of DME or AE      OT Goals(Current goals can be found in the care plan section) Acute Rehab OT Goals Patient Stated Goal: to rehab then home  OT Frequency:             Co-evaluation PT/OT/SLP Co-Evaluation/Treatment: Yes Reason for Co-Treatment: For patient/therapist safety   OT goals addressed during session: ADL's and self-care      End of Session Equipment Utilized During  Treatment: Gait belt;Rolling walker  Activity Tolerance: Patient limited by pain;Patient limited by fatigue Patient left: in chair;with call bell/phone within reach   Time: 0847-0911 OT Time Calculation (min): 24 min Charges:  OT General Charges $OT Visit: 1 Procedure OT Evaluation $Initial OT Evaluation Tier I: 1 Procedure OT Treatments $Self Care/Home Management : 8-22 mins  Evette GeorgesLeonard, Elfida Shimada Eva [phone] 12/30/2013, 12:12 PM

## 2013-12-30 NOTE — Evaluation (Signed)
Physical Therapy Evaluation Patient Details Name: Molly [name] MRN: [id number] DOB: 09/11/1935 Today's Date: 12/30/2013   History of Present Illness  Fall now s/p IM nail of femur. PMHx:HTN  Clinical Impression  Pt. Presents to PT with a decrease in her usual level of functional independence due to femur fx.  Will need and will benefit from acute PT to address mobility and below issues.  Recommend SNF for post acute rehab as pt.   cannot functionally manage herself right now and will benefit from 5 x / week therapy.      Follow Up Recommendations      Equipment Recommendations       Recommendations for Other Services       Precautions / Restrictions Precautions Precautions: Fall Restrictions Weight Bearing Restrictions: Yes RLE Weight Bearing: Weight bearing as tolerated      Mobility  Bed Mobility Overal bed mobility: Needs Assistance;+2 for physical assistance Bed Mobility: Supine to Sit     Supine to sit: Mod assist;+2 for physical assistance;HOB elevated     General bed mobility comments: mod assist of 2 for supine to sit at EOB.  Pt. reluctant to move R LE and needed physical assist  Transfers Overall transfer level: Needs assistance Equipment used: Rolling walker (2 wheeled) Transfers: Sit to/from Stand Sit to Stand: Mod assist;+2 physical assistance         General transfer comment: mod assist of 2 to rise to stand   Ambulation/Gait Ambulation/Gait assistance: Mod assist;+2 safety/equipment Ambulation Distance (Feet): 10 Feet Assistive device: Rolling walker (2 wheeled) Gait Pattern/deviations: Step-to pattern;Decreased stride length;Antalgic Gait velocity: decreased   General Gait Details: Pt. needed frequent verbal and manual cueing for RW distance and placement in relation to step length.  Second person for Camera operatorbringing recliner chair  Stairs            Wheelchair Mobility    Modified Rankin (Stroke Patients Only)       Balance Overall  balance assessment: Needs assistance Sitting-balance support: Bilateral upper extremity supported;Feet supported Sitting balance-Leahy Scale: Fair     Standing balance support: Bilateral upper extremity supported;During functional activity Standing balance-Leahy Scale: Poor Standing balance comment: needed external and RW support for upright stance                             Pertinent Vitals/Pain Pain Assessment: 0-10 Pain Score: 8  Pain Location: right upper thigh  Pain Descriptors / Indicators: Aching Pain Intervention(s): Monitored during session;Repositioned;Ice applied    Home Living Family/patient expects to be discharged to:: Skilled nursing facility Living Arrangements: Spouse/significant other               Additional Comments: 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     Hand Dominance   Dominant Hand: Right    Extremity/Trunk Assessment   Upper Extremity Assessment: Defer to OT evaluation           Lower Extremity Assessment: RLE deficits/detail RLE Deficits / Details: expected post op deficits present; able to ankle pump and quad set    Cervical / Trunk Assessment: Normal  Communication   Communication: No difficulties  Cognition Arousal/Alertness: Awake/alert Behavior During Therapy: WFL for tasks assessed/performed Overall Cognitive Status:  Within Functional Limits for tasks assessed                      General Comments      Exercises General Exercises - Lower Extremity Ankle Circles/Pumps: AROM;Both;10 reps;Seated Quad Sets: AROM;Both;10 reps;Seated Long Arc Quad: AROM;Right;Left;10 reps;Seated      Assessment/Plan    PT Assessment    PT Diagnosis Difficulty walking;Abnormality of gait;Acute pain   PT Problem List    PT Treatment  Interventions     PT Goals (Current goals can be found in the Care Plan section) Acute Rehab PT Goals Patient Stated Goal: to rehab then home    Frequency     Barriers to discharge        Co-evaluation PT/OT/SLP Co-Evaluation/Treatment: Yes Reason for Co-Treatment: For patient/therapist safety PT goals addressed during session: Mobility/safety with mobility;Balance;Proper use of DME OT goals addressed during session: ADL's and self-care       End of Session Equipment Utilized During Treatment: Gait belt Activity Tolerance: Patient limited by pain;Patient limited by fatigue Patient left: in chair;with call bell/phone within reach Nurse Communication: Mobility status         Time: 0850-0913 PT Time Calculation (min) (ACUTE ONLY): 23 min   Charges:   PT Evaluation $Initial PT Evaluation Tier I: 1 Procedure PT Treatments $Gait Training: 8-22 mins   PT G Codes:          Ferman HammingBlankenship, Dorenda Pfannenstiel B 12/30/2013, 1:16 PM Weldon PickingSusan [place] [facility] Acute Rehab Services [phone] Beeper [phone]

## 2013-12-30 NOTE — Progress Notes (Signed)
[facility] Progress Note  Subjective  Doing ok this am C/o moderate pain in R thigh Worked with PT this am and did well Tolerating diet +flatus    Review of Systems  Constitutional: Negative for fever and chills.  Eyes: Negative for blurred vision.  Respiratory: Negative for shortness of breath and wheezing.   Cardiovascular: Negative for chest pain and palpitations.  Gastrointestinal: Negative for nausea, vomiting and abdominal pain.  Genitourinary: Negative for dysuria.  Neurological: Negative for tingling and sensory change.     Objective   BP 116/48 mmHg  Pulse 91  Temp(Src) 98.2 F (36.8 C) (Oral)  Resp 16  Ht 5\' 2"  (1.575 m)  Wt 63.504 kg (140 lb)  BMI 25.60 kg/m2  SpO2 97%  Intake/Output      11/26 0701 - 11/27 0700 11/27 0701 - 11/28 0700   P.O. 480    I.V. (mL/kg) 1476.3 (23.2)    IV Piggyback     Total Intake(mL/kg) 1956.3 (30.8)    Urine (mL/kg/hr)     Total Output       Net +1956.3          Urine Occurrence 2 x      Labs  Results for Molly [name], Molly [name] (MRN [id number]) as of 12/30/2013 09:26  Ref. Range 12/30/2013 03:45  Sodium Latest Range: 137-147 mEq/L 136 (L)  Potassium Latest Range: 3.7-5.3 mEq/L 3.7  Chloride Latest Range: 96-112 mEq/L 100  CO2 Latest Range: 19-32 mEq/L 27  BUN Latest Range: 6-23 mg/dL 13  Creatinine Latest Range: 0.50-1.10 mg/dL 7.820.56  Calcium Latest Range: 8.4-10.5 mg/dL 7.7 (L)  GFR calc non Af Amer Latest Range: >90 mL/min 88 (L)  GFR calc Af Amer Latest Range: >90 mL/min >90  Glucose Latest Range: 70-99 mg/dL 956106 (H)  Anion gap Latest Range: 5-15  9  WBC Latest Range: 4.0-10.5 K/uL 6.7  RBC Latest Range: 3.87-5.11 MIL/uL 2.47 (L)  Hemoglobin Latest Range: 12.0-15.0 g/dL 7.4 (L)  HCT Latest Range: 36.0-46.0 % 22.2 (L)  MCV Latest Range: 78.0-100.0 fL 89.9  MCH Latest Range: 26.0-34.0 pg 30.0  MCHC Latest Range: 30.0-36.0 g/dL 21.333.3  RDW Latest Range: 11.5-15.5 % 13.8  Platelets Latest Range:  150-400 K/uL 142 (L)    Exam  Gen: awake and alert, sitting in bedside chair, NAD Lungs: clear anterior fields Cardiac: RRR Abd: soft, NTND, + BS  Ext:       Right Lower Extremity   Dressings c/d/i  DPN, SPN, TN sensation intact  EHL, FHL, AT, PT, peroneals, gastroc motor intact  + Quad set  Active knee flexion intact   Ext warm  + DP pulse   + Swelling   No DCT    Assessment and Plan   POD/HD#: 2    78 y/o female s/p fall with R subtroch femur fracture  1. Fall  2. R subtroch femur fracture s/p IMN               WBAT             ROM as tolerated             PT/OT evals             Ice prn    Dressing changes as needed               3. ABL anemia  Will transfuse 2 units of PRBC's   Check CBC in am   4. Medical issues  Per primary service  5. DVT/PE prophylaxis             Lovenox x 3 weeks post op   6. Pain management               adjust accordingly   Continue with norco  Added oxy IR  Morphine 0.5-1mg  q2h prn   Robaxin 500 mg po q6h prn   7. FEN             Advance diet as toelrated  8. Dispo             PT/OT recommend SNF  SW consult for SNF    Mearl LatinKeith W. Ligia Duguay, PA-C [facility] [phone]([phone] (O) 12/30/2013 9:25 AM

## 2013-12-31 LAB — TYPE AND SCREEN
ABO/RH(D): O POS
ANTIBODY SCREEN: NEGATIVE
UNIT DIVISION: 0
Unit division: 0

## 2013-12-31 LAB — CBC
HCT: 27.8 % — ABNORMAL LOW (ref 36.0–46.0)
HEMOGLOBIN: 9 g/dL — AB (ref 12.0–15.0)
MCH: 27.8 pg (ref 26.0–34.0)
MCHC: 32.4 g/dL (ref 30.0–36.0)
MCV: 85.8 fL (ref 78.0–100.0)
PLATELETS: 156 10*3/uL (ref 150–400)
RBC: 3.24 MIL/uL — AB (ref 3.87–5.11)
RDW: 14.9 % (ref 11.5–15.5)
WBC: 8.9 10*3/uL (ref 4.0–10.5)

## 2013-12-31 MED ORDER — DOCUSATE SODIUM 100 MG PO CAPS
200.0000 mg | ORAL_CAPSULE | Freq: Two times a day (BID) | ORAL | Status: DC
  Administered 2013-12-31 – 2014-01-01 (×4): 200 mg via ORAL
  Filled 2013-12-31 (×5): qty 2

## 2013-12-31 MED ORDER — FUROSEMIDE 10 MG/ML IJ SOLN
INTRAMUSCULAR | Status: AC
  Administered 2013-12-31: 20 mg
  Filled 2013-12-31: qty 2

## 2013-12-31 MED ORDER — METHOCARBAMOL 500 MG PO TABS
500.0000 mg | ORAL_TABLET | Freq: Three times a day (TID) | ORAL | Status: DC
  Administered 2013-12-31 – 2014-01-02 (×7): 500 mg via ORAL
  Filled 2013-12-31 (×9): qty 1

## 2013-12-31 NOTE — Progress Notes (Signed)
Patient ID: Molly [name]  female  ZOX:[id number]    DOB: 09/11/1935    DOA: 12/28/2013  PCP: Lillia MountainGRIFFIN,JOHN JOSEPH, MD  Brief history of present illness The patient is a 78 year old female with hypertension, lives with her family at home, presented with severe right hip pain and thigh pain after a mechanical fall. Denied any syncopal episode. X-rays of the right hip showed acute subtrochanteric right femur fracture. Orthopedics was consulted, patient underwent right intramedullary nail surgery on 11/25.  Assessment/Plan: Principal Problem:   Subtrochanteric fracture of right femur - Status post right intramedullary nail surgery, postop day #3 - Continue pain control, Lovenox - Management per orthopedics - PTOT evaluation recommending skilled nursing facility-> patient was hoping to go home, placed CIR and social work  - Continue stool softeners  Active Problems:   Essential hypertension - Stable, continue Lotensin, Norvasc, labetalol    Hypokalemia- resolved  Hypothyroidism - Continue Synthroid  Anemia: Likely anemia due to acute blood loss postoperatively  - Transfused 2 units packed RBCs on 11/28, hemoglobin stable at 9.0  DVT Prophylaxis:Lovenox   Code Status:Full code   Family Communication:  Disposition:   Consultants:  Orthopedics   Procedures:  IM nailing   Antibiotics:  Keflex perioperatively    Subjective:  patient seen and examined, disappointed with PT evaluation, feels somewhat better today, wants to get up with PT again, no acute issues overnight  Objective: Weight change:   Intake/Output Summary (Last 24 hours) at 12/31/13 1021 Last data filed at 12/31/13 0500  Gross per 24 hour  Intake 3337.75 ml  Output    500 ml  Net 2837.75 ml   Blood pressure 115/58, pulse 83, temperature 98.6 F (37 C), temperature source Oral, resp. rate 18, height 5\' 2"  (1.575 m), weight 63.504 kg (140 lb), SpO2 99 %.  Physical Exam: General: A x O x3 CVS:  S1-S2 clear Chest: CTAB Abdomen: soft, NT, ND, NBS  Extremities: no c/c/e bilaterally   Lab Results: Basic Metabolic Panel:  Recent Labs Lab 12/29/13 0340 12/30/13 0345  NA 138 136*  K 3.3* 3.7  CL 99 100  CO2 28 27  GLUCOSE 153* 106*  BUN 20 13  CREATININE 0.61  0.61 0.56  CALCIUM 8.1* 7.7*   Liver Function Tests: No results for input(s): AST, ALT, ALKPHOS, BILITOT, PROT, ALBUMIN in the last 168 hours. No results for input(s): LIPASE, AMYLASE in the last 168 hours. No results for input(s): AMMONIA in the last 168 hours. CBC:  Recent Labs Lab 12/28/13 1455  12/30/13 0345 12/31/13 0337  WBC 8.4  < > 6.7 8.9  NEUTROABS 6.3  --   --   --   HGB 11.6*  < > 7.4* 9.0*  HCT 35.1*  < > 22.2* 27.8*  MCV 87.3  < > 89.9 85.8  PLT 206  < > 142* 156  < > = values in this interval not displayed. Cardiac Enzymes: No results for input(s): CKTOTAL, CKMB, CKMBINDEX, TROPONINI in the last 168 hours. BNP: Invalid input(s): POCBNP CBG: No results for input(s): GLUCAP in the last 168 hours.   Micro Results: Recent Results (from the past 240 hour(s))  MRSA PCR Screening     Status: None   Collection Time: 12/28/13  8:41 PM  Result Value Ref Range Status   MRSA by PCR NEGATIVE NEGATIVE Final    Comment:        The GeneXpert MRSA Assay (FDA approved for NASAL specimens only), is one component of a  comprehensive MRSA colonization surveillance program. It is not intended to diagnose MRSA infection nor to guide or monitor treatment for MRSA infections.     Studies/Results: Dg Chest 1 View  12/28/2013   CLINICAL DATA:  Fall, hip injury  EXAM: CHEST - 1 VIEW  COMPARISON:  None.  FINDINGS: Cardiac silhouette is enlarged. The aorta is ectatic. Central venous congestion. No overt pulmonary edema. No pleural fluid or pneumothorax.  IMPRESSION: Central venous congestion.  No overt pulmonary edema.   Electronically Signed   By: Genevive Bi M.D.   On: 12/28/2013 16:37   Dg  Pelvis 1-2 Views  12/28/2013   CLINICAL DATA:  Fall.  Pelvic pain.  Initial encounter.  EXAM: PELVIS - 1-2 VIEW  COMPARISON:  None.  FINDINGS: There is no evidence of pelvic fracture or diastasis. No pelvic bone lesions are seen. Proximal right femoral shaft fracture demonstrated.  IMPRESSION: No evidence of pelvic fracture. Proximal right femur fracture noted.   Electronically Signed   By: Myles Rosenthal M.D.   On: 12/28/2013 16:36   Dg Femur Right  12/29/2013   CLINICAL DATA:  Intra medullary nail fixation  EXAM: RIGHT FEMUR - 2 VIEW; DG C-ARM 61-120 MIN  COMPARISON:  12/28/2013 femur radiography  FINDINGS: Fluoroscopy shows placement of a intra medullary nail for fixation of a subtrochanteric femur fracture. Fracture alignment is excellent. No adverse hardware findings.  IMPRESSION: Subtrochanteric femur fracture status post ORIF. No adverse findings.   Electronically Signed   By: Tiburcio Pea M.D.   On: 12/29/2013 04:48   Dg Femur Right  12/28/2013   CLINICAL DATA:  Status post fall today with a right femur fracture. Initial encounter.  EXAM: RIGHT FEMUR - 2 VIEW  COMPARISON:  None.  FINDINGS: The patient is in traction. The patient has a subtrochanteric right femur fracture with 1 shaft with medial displacement. The fracture has a spiral configuration and is centered 11 cm below the top of the greater trochanter. No other acute bony or joint abnormality is identified.  IMPRESSION: Acute subtrochanteric right femur fracture as described.   Electronically Signed   By: Drusilla Kanner M.D.   On: 12/28/2013 16:37   Pelvis Portable  12/29/2013   CLINICAL DATA:  Postop right hip.  EXAM: PORTABLE PELVIS 1-2 VIEWS  COMPARISON:  12/28/2013  FINDINGS: Subtrochanteric right femur fracture status post ORIF. No new osseous abnormality. Fracture alignment is excellent. The imaged pelvic ring is intact. The left hip is intact.  IMPRESSION: Subtrochanteric right femur fracture ORIF.  No adverse findings.    Electronically Signed   By: Tiburcio Pea M.D.   On: 12/29/2013 05:03   Hip Portable 1 View Right  12/29/2013   CLINICAL DATA:  Postop right hip fracture  EXAM: PORTABLE RIGHT HIP - 1 VIEW  COMPARISON:  12/28/2013  FINDINGS: Lateral image of the right hip shows changes of subtrochanteric femur fracture ORIF using an intra medullary nail. In the lateral projection the fracture alignment is excellent.  IMPRESSION: Subtrochanteric right femur fracture ORIF.  No adverse findings.   Electronically Signed   By: Tiburcio Pea M.D.   On: 12/29/2013 05:05   Dg C-arm 1-60 Min  12/29/2013   CLINICAL DATA:  Intra medullary nail fixation  EXAM: RIGHT FEMUR - 2 VIEW; DG C-ARM 61-120 MIN  COMPARISON:  12/28/2013 femur radiography  FINDINGS: Fluoroscopy shows placement of a intra medullary nail for fixation of a subtrochanteric femur fracture. Fracture alignment is excellent. No adverse hardware findings.  IMPRESSION:  Subtrochanteric femur fracture status post ORIF. No adverse findings.   Electronically Signed   By: Tiburcio PeaJonathan  Watts M.D.   On: 12/29/2013 04:48    Medications: Scheduled Meds: . amLODipine  10 mg Oral Daily  . benazepril  20 mg Oral Daily  . docusate sodium  200 mg Oral BID  . enoxaparin (LOVENOX) injection  40 mg Subcutaneous Q24H  . labetalol  300 mg Oral BID  . levothyroxine  88 mcg Oral QAC breakfast  . methocarbamol  500 mg Oral TID  . senna  1 tablet Oral BID      LOS: 3 days   RAI,RIPUDEEP M.D. [facility] 12/31/2013, 10:21 AM Pager: [phone]  If 7PM-7AM, please contact night-coverage [email or web address] Password [email or web address]

## 2013-12-31 NOTE — Plan of Care (Signed)
Problem: Phase I Progression Outcomes Goal: OOB as tolerated unless otherwise ordered Outcome: Completed/Met Date Met:  12/31/13 Goal: Sutures/staples intact Outcome: Completed/Met Date Met:  12/31/13 Goal: Vital signs/hemodynamically stable Outcome: Completed/Met Date Met:  12/31/13 Goal: Other Phase I Outcomes/Goals Outcome: Completed/Met Date Met:  12/31/13  Problem: Phase II Progression Outcomes Goal: Progress activity as tolerated unless otherwise ordered Outcome: Completed/Met Date Met:  12/31/13 Goal: Vital signs stable Outcome: Completed/Met Date Met:  12/31/13

## 2013-12-31 NOTE — Progress Notes (Signed)
Subjective: 3 Days Post-Op Procedure(s) (LRB): INTRAMEDULLARY (IM) NAIL FEMORAL (Right) Patient reports pain as 3 on 0-10 scale.    Objective: Vital signs in last 24 hours: Temp:  [98.5 F (36.9 C)-99.3 F (37.4 C)] 98.6 F (37 C) (11/28 0114) Pulse Rate:  [81-93] 83 (11/28 0114) Resp:  [14-18] 18 (11/28 0400) BP: (91-119)/(35-73) 113/42 mmHg (11/28 0114) SpO2:  [95 %-99 %] 99 % (11/28 0400)  Intake/Output from previous day: 11/27 0701 - 11/28 0700 In: 3577.8 [P.O.:720; I.V.:2307.5; Blood:550.3] Out: 500 [Urine:500] Intake/Output this shift:     Recent Labs  12/28/13 1455 12/29/13 0340 12/30/13 0345 12/31/13 0337  HGB 11.6* 9.1*  9.0* 7.4* 9.0*    Recent Labs  12/30/13 0345 12/31/13 0337  WBC 6.7 8.9  RBC 2.47* 3.24*  HCT 22.2* 27.8*  PLT 142* 156    Recent Labs  12/29/13 0340 12/30/13 0345  NA 138 136*  K 3.3* 3.7  CL 99 100  CO2 28 27  BUN 20 13  CREATININE 0.61  0.61 0.56  GLUCOSE 153* 106*  CALCIUM 8.1* 7.7*    Recent Labs  12/28/13 1455  INR 1.08    Neurologically intact ABD soft Neurovascular intact Sensation intact distally Intact pulses distally Dorsiflexion/Plantar flexion intact Incision: moderate drainage  Assessment/Plan: 3 Days Post-Op Procedure(s) (LRB): INTRAMEDULLARY (IM) NAIL FEMORAL (Right) Advance diet Up with therapy  Expect discharge on Monday  Molly [name] 12/31/2013, 9:33 AM

## 2013-12-31 NOTE — Progress Notes (Signed)
Physical Therapy Treatment Patient Details Name: Molly [name] MRN: [id number] DOB: 09/11/1935 Today's Date: 12/31/2013    History of Present Illness 78 y.o. female [facility]13 s/p fall with resultant IM nail of the femur.  Pt with significant PMHx of HTN.    PT Comments    Pt is progressing very well with her mobility with RW.  She was able to walk into the hallway with RW and chair to follow for safety and to encourage increased gait distance today.  Exercise handout given and exercises reviewed.  PT is highly motivated to get back to her prior level of function.  PT will continue to follow acutely and at this time continue to recommend SNF placement.   Follow Up Recommendations  SNF     Equipment Recommendations  Rolling walker with 5" wheels    Recommendations for Other Services   NA     Precautions / Restrictions Precautions Precautions: Fall Restrictions RLE Weight Bearing: Weight bearing as tolerated    Mobility   Transfers Overall transfer level: Needs assistance Equipment used: Rolling walker (2 wheeled) Transfers: Sit to/from Stand Sit to Stand: +2 physical assistance;Min assist         General transfer comment: Two person min assist to get to standing from low recliner chair.  Verbal cues for safe hand placement and assit to power up and control descent down to sit.   Ambulation/Gait Ambulation/Gait assistance: +2 safety/equipment;Min guard (chair to follow for increased safety and distance) Ambulation Distance (Feet): 30 Feet Assistive device: Rolling walker (2 wheeled) Gait Pattern/deviations: Step-to pattern;Antalgic;Trunk flexed Gait velocity: decreased Gait velocity interpretation: Below normal speed for age/gender General Gait Details: Pt needed min assist to support trunk as she WB through her right leg during gait.  Verbal cues for safe RW use and correct LE sequencing.  Verbal cues as well for upright posture and correct hand  placement on RW.           Balance           Standing balance support: Bilateral upper extremity supported Standing balance-Leahy Scale: Poor Standing balance comment: Needs external support from RW and therapist in standing.                     Cognition Arousal/Alertness: Awake/alert Behavior During Therapy: WFL for tasks assessed/performed Overall Cognitive Status: Within Functional Limits for tasks assessed                      Exercises Total Joint Exercises Ankle Circles/Pumps: AROM;Both;10 reps;Supine Quad Sets: AROM;Right;10 reps;Supine Short Arc Quad: AROM;Right;10 reps;Seated Heel Slides: AAROM;Right;10 reps;Seated Hip ABduction/ADduction: AAROM;Right;10 reps;Seated        Pertinent Vitals/Pain Pain Assessment: 0-10 Pain Score: 7  Pain Location: right leg Pain Descriptors / Indicators: Aching Pain Intervention(s): Limited activity within patient's tolerance;Monitored during session;Repositioned           PT Goals (current goals can now be found in the care plan section) Acute Rehab PT Goals Patient Stated Goal: to go to rehab and go home.  To be independent again.  Progress towards PT goals: Progressing toward goals    Frequency  Min 3X/week (per our department protocol)    PT Plan Discharge plan needs to be updated       End of Session Equipment Utilized During Treatment: Gait belt Activity Tolerance: Patient limited by pain Patient left: in chair;with call bell/phone within reach;with family/visitor present  Time: [id number] PT Time Calculation (min) (ACUTE ONLY): 16 min  Charges:  $Gait Training: 8-22 mins                      Roniel Halloran B. Bartosz Luginbill, PT, DPT [phone]   12/31/2013, 9:28 AM

## 2014-01-01 LAB — HEPATIC FUNCTION PANEL
ALBUMIN: 2.6 g/dL — AB (ref 3.5–5.2)
ALK PHOS: 44 U/L (ref 39–117)
ALT: 15 U/L (ref 0–35)
AST: 27 U/L (ref 0–37)
BILIRUBIN TOTAL: 0.6 mg/dL (ref 0.3–1.2)
Total Protein: 5.8 g/dL — ABNORMAL LOW (ref 6.0–8.3)

## 2014-01-01 LAB — BASIC METABOLIC PANEL
Anion gap: 9 (ref 5–15)
BUN: 9 mg/dL (ref 6–23)
CHLORIDE: 97 meq/L (ref 96–112)
CO2: 29 meq/L (ref 19–32)
CREATININE: 0.5 mg/dL (ref 0.50–1.10)
Calcium: 8.1 mg/dL — ABNORMAL LOW (ref 8.4–10.5)
GFR calc Af Amer: >90 mL/min (ref >90)
GFR calc non Af Amer: >90 mL/min (ref >90)
GLUCOSE: 115 mg/dL — AB (ref 70–99)
POTASSIUM: 3.5 meq/L — AB (ref 3.7–5.3)
Sodium: 135 mEq/L — ABNORMAL LOW (ref 137–147)

## 2014-01-01 LAB — CBC
HEMATOCRIT: 26.8 % — AB (ref 36.0–46.0)
HEMOGLOBIN: 8.8 g/dL — AB (ref 12.0–15.0)
MCH: 28.5 pg (ref 26.0–34.0)
MCHC: 32.8 g/dL (ref 30.0–36.0)
MCV: 86.7 fL (ref 78.0–100.0)
Platelets: 180 10*3/uL (ref 150–400)
RBC: 3.09 MIL/uL — AB (ref 3.87–5.11)
RDW: 15.1 % (ref 11.5–15.5)
WBC: 7.5 10*3/uL (ref 4.0–10.5)

## 2014-01-01 MED ORDER — POTASSIUM CHLORIDE CRYS ER 10 MEQ PO TBCR
10.0000 meq | EXTENDED_RELEASE_TABLET | Freq: Every day | ORAL | Status: DC
Start: 2014-01-01 — End: 2014-01-02
  Administered 2014-01-01 – 2014-01-02 (×2): 10 meq via ORAL
  Filled 2014-01-01 (×3): qty 1

## 2014-01-01 NOTE — Progress Notes (Signed)
Patient's family upset about possibility of patient being discharged to SNF instead of CIR. Conditions for admittance to CIR explained to patients family, and social worker called by RN to talk to family about discharge options.

## 2014-01-01 NOTE — Discharge Summary (Signed)
Physician Discharge Summary  Molly [name] UJW:[id number] DOB: 09/11/1935 DOA: 12/28/2013  PCP: Lillia Mountain, MD  Admit date: 12/28/2013 Discharge date: 01/02/2014  Recommendations for Outpatient Follow-up:  1. Pt will need to follow up with PCP in 2-3 weeks post discharge 2. Please obtain BMP to evaluate electrolytes and kidney function 3. Please also check CBC to evaluate Hg and Hct levels 4. Home Health PT and OT were set up for the patient, follow up on plan and how she was doing post-operatively  Discharge Diagnoses:  Principal Problem:   Subtrochanteric fracture of right femur s/p surgical correction Active Problems:   Essential hypertension   Hypokalemia   Hypothyroidism   Anemia   Mild Hypocalcemia  Discharge Condition: Stable  Diet recommendation: Heart healthy diet discussed in details   History of present illness:  Molly [name] is a 78yo woman who was admitted on 11/25 after a fall causing severe right hip and thigh pain.  She was given pain control and imaging was done at that time in the ED showing acute subtrochanteric right femur fracture.  She was admitted for further work up of this issue and for surgery.  CXR done on admission showed central venous congestion, no edema.  Xray pelvis showed no pelvic fracture.  Xray femur on the right showed Acute subtrochanteric right femur fracture.  She underwent surgical correction with IM nail on 11/25 by the Orthopedic surgery team.   She had multiple post op films showing good placement of the IM nail.  She had some hypokalemia post op which was treated with potassium supplementation and improved.  She was continued on her home medications for HTN except for HCTZ.  This was held post op and at discharge given normotension.  She had some anemia post surgery for which she required a blood transfusion on 12/30/13 and her H/H stabilized.  She was further noted to have mild hypocalcemia, but this was explained by her hypoalbuminemia  and did correct.  She was eating, walking out of bed and having bowel movements.  She was deemed ready for discharge.    PT and OT initially thought she would require SNF at discharge, however, the patient progressed nicely in her plan and was able to qualify for home health PT and OT.    Hospital Course:  Principal Problem:   Subtrochanteric fracture of right femur Active Problems:   Essential hypertension   Hypokalemia   Hypothyroidism   Anemia   Mild Hypocalcemia  Procedures/Studies: Dg Chest 1 View  12/28/2013   CLINICAL DATA:  Fall, hip injury  EXAM: CHEST - 1 VIEW  COMPARISON:  None.  FINDINGS: Cardiac silhouette is enlarged. The aorta is ectatic. Central venous congestion. No overt pulmonary edema. No pleural fluid or pneumothorax.  IMPRESSION: Central venous congestion.  No overt pulmonary edema.   Electronically Signed   By: Genevive Bi M.D.   On: 12/28/2013 16:37   Dg Pelvis 1-2 Views  12/28/2013   CLINICAL DATA:  Fall.  Pelvic pain.  Initial encounter.  EXAM: PELVIS - 1-2 VIEW  COMPARISON:  None.  FINDINGS: There is no evidence of pelvic fracture or diastasis. No pelvic bone lesions are seen. Proximal right femoral shaft fracture demonstrated.  IMPRESSION: No evidence of pelvic fracture. Proximal right femur fracture noted.   Electronically Signed   By: Myles Rosenthal M.D.   On: 12/28/2013 16:36   Dg Femur Right  12/29/2013   CLINICAL DATA:  Intra medullary nail fixation  EXAM: RIGHT FEMUR -  2 VIEW; DG C-ARM 61-120 MIN  COMPARISON:  12/28/2013 femur radiography  FINDINGS: Fluoroscopy shows placement of a intra medullary nail for fixation of a subtrochanteric femur fracture. Fracture alignment is excellent. No adverse hardware findings.  IMPRESSION: Subtrochanteric femur fracture status post ORIF. No adverse findings.   Electronically Signed   By: Tiburcio PeaJonathan  Watts M.D.   On: 12/29/2013 04:48   Dg Femur Right  12/28/2013   CLINICAL DATA:  Status post fall today with a right  femur fracture. Initial encounter.  EXAM: RIGHT FEMUR - 2 VIEW  COMPARISON:  None.  FINDINGS: The patient is in traction. The patient has a subtrochanteric right femur fracture with 1 shaft with medial displacement. The fracture has a spiral configuration and is centered 11 cm below the top of the greater trochanter. No other acute bony or joint abnormality is identified.  IMPRESSION: Acute subtrochanteric right femur fracture as described.   Electronically Signed   By: Drusilla Kannerhomas  Dalessio M.D.   On: 12/28/2013 16:37   Pelvis Portable  12/29/2013   CLINICAL DATA:  Postop right hip.  EXAM: PORTABLE PELVIS 1-2 VIEWS  COMPARISON:  12/28/2013  FINDINGS: Subtrochanteric right femur fracture status post ORIF. No new osseous abnormality. Fracture alignment is excellent. The imaged pelvic ring is intact. The left hip is intact.  IMPRESSION: Subtrochanteric right femur fracture ORIF.  No adverse findings.   Electronically Signed   By: Tiburcio PeaJonathan  Watts M.D.   On: 12/29/2013 05:03   Hip Portable 1 View Right  12/29/2013   CLINICAL DATA:  Postop right hip fracture  EXAM: PORTABLE RIGHT HIP - 1 VIEW  COMPARISON:  12/28/2013  FINDINGS: Lateral image of the right hip shows changes of subtrochanteric femur fracture ORIF using an intra medullary nail. In the lateral projection the fracture alignment is excellent.  IMPRESSION: Subtrochanteric right femur fracture ORIF.  No adverse findings.   Electronically Signed   By: Tiburcio PeaJonathan  Watts M.D.   On: 12/29/2013 05:05   Dg C-arm 1-60 Min  12/29/2013   CLINICAL DATA:  Intra medullary nail fixation  EXAM: RIGHT FEMUR - 2 VIEW; DG C-ARM 61-120 MIN  COMPARISON:  12/28/2013 femur radiography  FINDINGS: Fluoroscopy shows placement of a intra medullary nail for fixation of a subtrochanteric femur fracture. Fracture alignment is excellent. No adverse hardware findings.  IMPRESSION: Subtrochanteric femur fracture status post ORIF. No adverse findings.   Electronically Signed   By: Tiburcio PeaJonathan   Watts M.D.   On: 12/29/2013 04:48    IM Nail to femur fracture 12/28/13  Consultations:  Orthopedic surgery  CM  Antibiotics:  2 dose cefazolin perioperatively   Discharge Exam: Filed Vitals:   01/02/14 1346  BP: 130/57  Pulse: 83  Temp: 98.3 F (36.8 C)  Resp: 18   Filed Vitals:   01/01/14 1427 01/01/14 2044 01/02/14 0530 01/02/14 1346  BP: 110/43 114/44 128/51 130/57  Pulse: 79 81 82 83  Temp: 98.7 F (37.1 C) 98.1 F (36.7 C) 98.1 F (36.7 C) 98.3 F (36.8 C)  TempSrc: Oral Oral Oral   Resp: 18 18 18 18   Height:      Weight:      SpO2: 96% 95% 98% 97%    General: Pt is alert, follows commands appropriately, not in acute distress Cardiovascular: Regular rate and rhythm, S1/S2 +, no murmurs, no rubs, no gallops Respiratory: Clear to auscultation bilaterally, no wheezing, no crackles, no rhonchi Abdominal: Soft, non tender, non distended, bowel sounds +, no guarding Extremities: no edema, no cyanosis,  pulses palpable bilaterally DP and PT Neuro: Grossly nonfocal  Discharge Instructions      Discharge Instructions    Weight bearing as tolerated    Complete by:  As directed             Medication List    STOP taking these medications        hydrochlorothiazide 25 MG tablet  Commonly known as:  HYDRODIURIL      TAKE these medications        amLODipine-benazepril 10-20 MG per capsule  Commonly known as:  LOTREL  Take 1 capsule by mouth daily.     CALCIUM PO  Take 1 tablet by mouth daily.     enoxaparin 40 MG/0.4ML injection  Commonly known as:  LOVENOX  Inject 0.4 mLs (40 mg total) into the skin daily.     HYDROcodone-acetaminophen 5-325 MG per tablet  Commonly known as:  NORCO  Take 1-2 tablets by mouth every 6 (six) hours as needed for moderate pain. MAXIMUM TOTAL ACETAMINOPHEN DOSE IS 4000 MG PER DAY     labetalol 300 MG tablet  Commonly known as:  NORMODYNE  Take 300 mg by mouth 2 (two) times daily.     levothyroxine 88 MCG  tablet  Commonly known as:  SYNTHROID, LEVOTHROID  Take 88 mcg by mouth daily.     multivitamin with minerals Tabs tablet  Take 1 tablet by mouth daily.     potassium chloride 10 MEQ tablet  Commonly known as:  K-DUR,KLOR-CON  Take 10 mEq by mouth daily.     sennosides-docusate sodium 8.6-50 MG tablet  Commonly known as:  SENOKOT-S  Take 2 tablets by mouth daily.       Follow-up Information    Follow up with Eulas PostLANDAU,JOSHUA P, MD. Schedule an appointment as soon as possible for a visit in 2 weeks.   Specialty:  Orthopedic Surgery   Contact information:   [address] [place] [address] [phone]       Follow up with [facility].   Why:  Someone from [facility] will contact you concerning start date and time for physical therapy.   Contact information:   [address] [place] [address] [phone]       The results of significant diagnostics from this hospitalization (including imaging, microbiology, ancillary and laboratory) are listed below for reference.     Microbiology: Recent Results (from the past 240 hour(s))  MRSA PCR Screening     Status: None   Collection Time: 12/28/13  8:41 PM  Result Value Ref Range Status   MRSA by PCR NEGATIVE NEGATIVE Final    Comment:        The GeneXpert MRSA Assay (FDA approved for NASAL specimens only), is one component of a comprehensive MRSA colonization surveillance program. It is not intended to diagnose MRSA infection nor to guide or monitor treatment for MRSA infections.      Labs: Basic Metabolic Panel:  Recent Labs Lab 12/28/13 1455 12/29/13 0340 12/30/13 0345 01/01/14 0335  NA 137 138 136* 135*  K 3.4* 3.3* 3.7 3.5*  CL 98 99 100 97  CO2 26 28 27 29   GLUCOSE 119* 153* 106* 115*  BUN 25* 20 13 9   CREATININE 0.83 0.61  0.61 0.56 0.50  CALCIUM 9.0 8.1* 7.7* 8.1*   Liver Function Tests:  Recent Labs Lab 01/01/14 1640  AST 27  ALT 15   ALKPHOS 44  BILITOT 0.6  PROT 5.8*  ALBUMIN 2.6*   CBC:  Recent Labs Lab 12/28/13 1455 12/29/13 0340 12/30/13 0345 12/31/13 0337 01/01/14 0335  WBC 8.4 12.1*  11.8* 6.7 8.9 7.5  NEUTROABS 6.3  --   --   --   --   HGB 11.6* 9.1*  9.0* 7.4* 9.0* 8.8*  HCT 35.1* 27.2*  27.5* 22.2* 27.8* 26.8*  MCV 87.3 89.8  89.6 89.9 85.8 86.7  PLT 206 191  184 142* 156 180    SIGNED: Time coordinating discharge: 45  minutes  Jakeim Sedore, MD  [facility] 01/02/2014, 4:40 PM Pager [phone]  If 7PM-7AM, please contact night-coverage [email or web address] Password [email or web address]

## 2014-01-01 NOTE — Clinical Social Work Placement (Signed)
Clinical Social Work Department CLINICAL SOCIAL WORK PLACEMENT NOTE 01/01/2014  Patient:  Molly [name],[name]  Account Number:  [id number] Admit date:  12/28/2013  Clinical Social Worker:  Read DriversEGINA Shatonya Passon, LCSWA  Date/time:  01/01/2014 11:19 AM  Clinical Social Work is seeking post-discharge placement for this patient at the following level of care:   SKILLED NURSING   (*CSW will update this form in Epic as items are completed)   01/01/2014  Patient/family provided with [facility] Department of Clinical Social Work's list of facilities offering this level of care within the geographic area requested by the patient (or if unable, by the patient's family).  01/01/2014  Patient/family informed of their freedom to choose among providers that offer the needed level of care, that participate in Medicare, Medicaid or managed care program needed by the patient, have an available bed and are willing to accept the patient.  01/01/2014  Patient/family informed of [facility]' ownership interest in [facility], as well as of the fact that they are under no obligation to receive care at this facility.  PASARR submitted to EDS on 01/01/2014 PASARR number received on 01/01/2014  FL2 transmitted to all facilities in geographic area requested by pt/family on  01/01/2014 FL2 transmitted to all facilities within larger geographic area on   Patient informed that his/her managed care company has contracts with or will negotiate with  certain facilities, including the following:     Patient/family informed of bed offers received:   Patient chooses bed at  Physician recommends and patient chooses bed at    Patient to be transferred to  on   Patient to be transferred to facility by  Patient and family notified of transfer on  Name of family member notified:    The following physician request were entered in Epic:   Additional Comments:   Vickii PennaGina Rasheedah Reis, LCSWA [phone]  Psychiatric &  Orthopedics (5N 1-16) Clinical Social Worker

## 2014-01-01 NOTE — Clinical Social Work Psychosocial (Signed)
Clinical Social Work Department BRIEF PSYCHOSOCIAL ASSESSMENT 01/01/2014  Patient:  Molly [name],Molly [name]     Account Number:  [id number]     Admit date:  12/28/2013  Clinical Social Worker:  Read DriversINGLE,Jakwan Sally, LCSWA  Date/Time:  01/01/2014 11:12 AM  Referred by:  Physician  Date Referred:  01/01/2014 Referred for  SNF Placement  Psychosocial assessment   Other Referral:   none   Interview type:  Patient Other interview type:   none    PSYCHOSOCIAL DATA Living Status:  HUSBAND Admitted from facility:   Level of care:   Primary support name:  Danny Primary support relationship to patient:  SPOUSE Degree of support available:   adequate    CURRENT CONCERNS Current Concerns  Post-Acute Placement   Other Concerns:   none    SOCIAL WORK ASSESSMENT / PLAN CSW assessed pt at bedside along with ortho PA.  PT is recommending SNF/STR once medically discharged.  Patient is aware and agreeable. Husband wishes for patient to be considered for CIR, but cannot provide 24hr supervision once patient has been discharged. Pt husband is aware of the SNF recommendation in leiu of CIR.  PA has addressed this concern as well with the family and projected that patient will be discharged Monday to SNF.  Bed offers will be presented to patient/husband on Monday.  Per, PA, patient is progressing according to plan.  Patient is hopeful regarding recovery.   Assessment/plan status:  Psychosocial Support/Ongoing Assessment of Needs Other assessment/ plan:   FL2  PASARR   Information/referral to community resources:   SNF/STR    PATIENT'S/FAMILY'S RESPONSE TO PLAN OF CARE: patient is agreeable to SNF.       Vickii PennaGina Kery Haltiwanger, LCSWA [phone]  Psychiatric & Orthopedics (5N 1-16) Clinical Social Worker

## 2014-01-01 NOTE — Progress Notes (Addendum)
[facility] PROGRESS NOTE  Molly FessMary A [name] ZOX:[id number] DOB: 09/11/1935 DOA: 12/28/2013 PCP: Lillia MountainGRIFFIN,JOHN JOSEPH, MD  Brief narrative: Ms. Molly Mooshanos [name] a 78yo woman with PMH of HTN, presented with a Fall and right sided femur fracture.  Underwent IM nail surgery on 11/25.  PT/OT recommending SNF.   Assessment and Plan:    Subtrochanteric fracture of right femur s/p surgical correction - Patient progressing well per discussion with orthopedics - PT/OT recommending SNF - SNF placement being sought - Stool softeners, pain control  Essential hypertension - well controlled - She is home medications including amlodipine-benazepril, labetalol - home HCTZ on hold still, will continue to hold given BP's  Hypothyroidism - Continue home synthroid    Hypokalemia - Resolved, but mildly low today.   - Restart home dose of KCl 10 mEq per day  Anemia - Stable today, post surgical, mild  Mild hypocalcemia - Add on LFTs today to check for albumin - Possibly related to hypoalbuminemia.   DVT prophylaxis  Lovenox SQ while pt is in hospital  Code Status: Full Family Communication: Pt at bedside Disposition Plan: SNF, possibly tomorrow   IV Access:   Peripheral IV Procedures and diagnostic studies:    No results found.  Medical Consultants:   Orthopedics  Other Consultants:   Physical therapy  Anti-Infectives:   none  Molly [name], Elic Vencill, MD  [facility] Pager [phone]  If 7PM-7AM, please contact night-coverage [email or web address] Password [email or web address] 01/01/2014, 10:46 AM   LOS: 4 days   HPI/Subjective: No events overnight.  Discussed with patient and orthopedics PA today.  SNF is recommended for strengthening.  Patient has strong opinions about different places in town, and [facility] was recommended to her.  By PA report, patient walked well in the hallway today.  Objective: Filed Vitals:   12/31/13 0939 12/31/13 1344 12/31/13 1930 01/01/14 0540  BP: 115/58 111/54 120/49 117/51  Pulse:   81 87 95  Temp:  98.2 F (36.8 C) 98.8 F (37.1 C) 99 F (37.2 C)  TempSrc:  Oral Oral Oral  Resp:  18 18 18   Height:      Weight:      SpO2:  99% 94% 94%    Intake/Output Summary (Last 24 hours) at 01/01/14 1046 Last data filed at 01/01/14 0800  Gross per 24 hour  Intake    960 ml  Output      0 ml  Net    960 ml    Exam:   General:  Pt is alert, follows commands appropriately, NAD, elderly woman  Cardiovascular: Regular rate and rhythm, S1/S2  Respiratory: Clear to auscultation bilaterally, no wheezing  Abdomen: Soft, non tender, non distended, + BS  Extremities: No edema, no neurovascular compromise, moving both LE, warm  Neuro: Grossly nonfocal  Data Reviewed: Basic Metabolic Panel:  Recent Labs Lab 12/28/13 1455 12/29/13 0340 12/30/13 0345 01/01/14 0335  NA 137 138 136* 135*  K 3.4* 3.3* 3.7 3.5*  CL 98 99 100 97  CO2 26 28 27 29   GLUCOSE 119* 153* 106* 115*  BUN 25* 20 13 9   CREATININE 0.83 0.61  0.61 0.56 0.50  CALCIUM 9.0 8.1* 7.7* 8.1*   CBC:  Recent Labs Lab 12/28/13 1455 12/29/13 0340 12/30/13 0345 12/31/13 0337 01/01/14 0335  WBC 8.4 12.1*  11.8* 6.7 8.9 7.5  NEUTROABS 6.3  --   --   --   --   HGB 11.6* 9.1*  9.0* 7.4* 9.0* 8.8*  HCT 35.1* 27.2*  27.5* 22.2*  27.8* 26.8*  MCV 87.3 89.8  89.6 89.9 85.8 86.7  PLT 206 191  184 142* 156 180     Recent Results (from the past 240 hour(s))  MRSA PCR Screening     Status: None   Collection Time: 12/28/13  8:41 PM  Result Value Ref Range Status   MRSA by PCR NEGATIVE NEGATIVE Final    Comment:        The GeneXpert MRSA Assay (FDA approved for NASAL specimens only), is one component of a comprehensive MRSA colonization surveillance program. It is not intended to diagnose MRSA infection nor to guide or monitor treatment for MRSA infections.      Scheduled Meds: . amLODipine  10 mg Oral Daily  . benazepril  20 mg Oral Daily  . docusate sodium  200 mg Oral BID  .  enoxaparin (LOVENOX) injection  40 mg Subcutaneous Q24H  . labetalol  300 mg Oral BID  . levothyroxine  88 mcg Oral QAC breakfast  . methocarbamol  500 mg Oral TID  . senna  1 tablet Oral BID   Continuous Infusions:

## 2014-01-02 ENCOUNTER — Encounter (HOSPITAL_COMMUNITY): Payer: Self-pay | Admitting: Orthopedic Surgery

## 2014-01-02 DIAGNOSIS — R52 Pain, unspecified: Secondary | ICD-10-CM | POA: Insufficient documentation

## 2014-01-02 NOTE — Progress Notes (Signed)
Physical Therapy Treatment Patient Details Name: Molly FessMary A [name] MRN: [id number] DOB: 09/11/1935 Today's Date: 01/02/2014    History of Present Illness 78 y.o. female [facility] s/p fall with resultant IM nail of the femur.  Pt with significant PMHx of HTN.    PT Comments    Pt is progressing very well with her mobility and gait with RW.  She is overall, at a supervision level.  I encouraged her to pursue going home instead of post-acute rehab and she was comforted by the fact that there would be skilled therapists coming to instruct her in her home.  She reports family could come and check on her in the morning (leave work) and that most of them would be home by 2:30pm on a daily basis.  PT will return this PM for stair training if it is determined she will d/c today.       Follow Up Recommendations  Home health PT     Equipment Recommendations  Rolling walker with 5" wheels;3in1 (PT)    Recommendations for Other Services   NA     Precautions / Restrictions Restrictions Weight Bearing Restrictions: Yes RLE Weight Bearing: Weight bearing as tolerated    Mobility  Bed Mobility Overal bed mobility: Needs Assistance Bed Mobility: Supine to Sit     Supine to sit: Supervision     General bed mobility comments: Supervision for safety, extra time needed to progress her right leg to EOB.   Transfers Overall transfer level: Needs assistance Equipment used: Rolling walker (2 wheeled) Transfers: Sit to/from Stand Sit to Stand: Supervision         General transfer comment: supervision for safety  Ambulation/Gait Ambulation/Gait assistance: Supervision Ambulation Distance (Feet): 200 Feet Assistive device: Rolling walker (2 wheeled) Gait Pattern/deviations: Step-through pattern;Antalgic Gait velocity: decreased Gait velocity interpretation: Below normal speed for age/gender General Gait Details: Supervision for safety.  Mildly antalgic gait pattern, much  improved over last session.               Balance Overall balance assessment: Needs assistance Sitting-balance support: Feet supported;No upper extremity supported Sitting balance-Leahy Scale: Good     Standing balance support: Bilateral upper extremity supported;No upper extremity supported;Single extremity supported Standing balance-Leahy Scale: Fair                      Cognition Arousal/Alertness: Awake/alert Behavior During Therapy: WFL for tasks assessed/performed Overall Cognitive Status: Within Functional Limits for tasks assessed                      Exercises Total Joint Exercises Ankle Circles/Pumps: AROM;Both;10 reps;Supine Quad Sets: AROM;Right;10 reps;Supine Short Arc Quad: AROM;Right;10 reps;Seated Heel Slides: AAROM;Right;10 reps;Seated Hip ABduction/ADduction: AAROM;Right;10 reps;Seated Long Arc Quad: AROM;Right;10 reps;Seated    General Comments General comments (skin integrity, edema, etc.): Spoke at length with pt re: the possibility of going home vs post- acute rehab.  She was concerned there would be no one there to help her exercise like me.  I reinforced to her that we would send her home with home therapy services to help progress her exercises and her walking.       Pertinent Vitals/Pain Pain Assessment: 0-10 Pain Score: 9  (although, no grimacing. ) Pain Location: right leg Pain Descriptors / Indicators: Aching Pain Intervention(s): Limited activity within patient's tolerance;Monitored during session;Repositioned           PT Goals (current goals can now be found  in the care plan section) Acute Rehab PT Goals Patient Stated Goal: pt would like to avoid SNF and if she doesn't qualify for CIR she would like home with home health.  Progress towards PT goals: Progressing toward goals    Frequency  Min 5X/week    PT Plan Discharge plan needs to be updated;Frequency needs to be updated       End of Session   Activity  Tolerance: Patient tolerated treatment well Patient left: in chair;with call bell/phone within reach     Time: 0928-0959 PT Time Calculation (min) (ACUTE ONLY): 31 min  Charges:  $Gait Training: 8-22 mins $Therapeutic Exercise: 8-22 mins                      Milderd Manocchio B. Liyana Suniga, PT, DPT [phone]   01/02/2014, 10:08 AM

## 2014-01-02 NOTE — Plan of Care (Signed)
Problem: Phase II Progression Outcomes Goal: Return of bowel function (flatus, BM) IF ABDOMINAL SURGERY:  Outcome: Completed/Met Date Met:  01/02/14 Goal: Discharge plan established Outcome: Completed/Met Date Met:  01/02/14 Goal: Tolerating diet Outcome: Completed/Met Date Met:  01/02/14  Problem: Phase III Progression Outcomes Goal: Pain controlled on oral analgesia Outcome: Completed/Met Date Met:  01/02/14 Goal: Activity at appropriate level-compared to baseline (UP IN CHAIR FOR HEMODIALYSIS)  Outcome: Completed/Met Date Met:  01/02/14 Goal: Voiding independently Outcome: Completed/Met Date Met:  01/02/14 Goal: IV changed to normal saline lock Outcome: Completed/Met Date Met:  01/02/14 Goal: Nasogastric tube discontinued Outcome: Not Applicable Date Met:  35/45/62 Goal: Discharge plan remains appropriate-arrangements made Outcome: Completed/Met Date Met:  01/02/14 Goal: Demonstrates TCDB, IS independently Outcome: Completed/Met Date Met:  01/02/14  Problem: Discharge Progression Outcomes Goal: Barriers To Progression Addressed/Resolved Outcome: Completed/Met Date Met:  01/02/14 Goal: Discharge plan in place and appropriate Outcome: Completed/Met Date Met:  01/02/14 Goal: Pain controlled with appropriate interventions Outcome: Completed/Met Date Met:  01/02/14 Goal: Hemodynamically stable Outcome: Completed/Met Date Met:  56/38/93 Goal: Complications resolved/controlled Outcome: Completed/Met Date Met:  01/02/14 Goal: Tolerating diet Outcome: Completed/Met Date Met:  01/02/14 Goal: Activity appropriate for discharge plan Outcome: Completed/Met Date Met:  01/02/14 Goal: Tubes and drains discontinued if indicated Outcome: Not Applicable Date Met:  73/42/87

## 2014-01-02 NOTE — Clinical Social Work Note (Signed)
Patient discharge disposition has changed from SNF to home with home health services. [facility] regarding [facility]. CSW signing off.  Molly [name], LCSWA [phone]) Licensed Clinical Social Worker Orthopedics [phone]([id number]) and Surgical [phone]([id number])

## 2014-01-02 NOTE — Progress Notes (Signed)
Patient ID: Hulan FessMary A Guest, female   DOB: 09/11/1935, 78 y.o.   MRN: [id number]     Subjective:  Patient reports pain as mild to moderate.  Patient waiting on pain medicine but over all doing well  Objective:   VITALS:   Filed Vitals:   01/01/14 0540 01/01/14 1427 01/01/14 2044 01/02/14 0530  BP: 117/51 110/43 114/44 128/51  Pulse: 95 79 81 82  Temp: 99 F (37.2 C) 98.7 F (37.1 C) 98.1 F (36.7 C) 98.1 F (36.7 C)  TempSrc: Oral Oral Oral Oral  Resp: 18 18 18 18   Height:      Weight:      SpO2: 94% 96% 95% 98%    ABD soft Sensation intact distally Dorsiflexion/Plantar flexion intact Incision: dressing C/D/I and no drainage  Patient has good ankle and foot motion and has been ambulating with the walker   Lab Results  Component Value Date   WBC 7.5 01/01/2014   HGB 8.8* 01/01/2014   HCT 26.8* 01/01/2014   MCV 86.7 01/01/2014   PLT 180 01/01/2014   BMET    Component Value Date/Time   NA 135* 01/01/2014 0335   K 3.5* 01/01/2014 0335   CL 97 01/01/2014 0335   CO2 29 01/01/2014 0335   GLUCOSE 115* 01/01/2014 0335   BUN 9 01/01/2014 0335   CREATININE 0.50 01/01/2014 0335   CALCIUM 8.1* 01/01/2014 0335   GFRNONAA >90 01/01/2014 0335   GFRAA >90 01/01/2014 0335     Assessment/Plan: 5 Days Post-Op   Principal Problem:   Subtrochanteric fracture of right femur Active Problems:   Essential hypertension   Hypokalemia   Advance diet Up with therapy WBAT Dry dressing prn Continue plan per medicine   Haskel KhanDOUGLAS Ainslee Sou 01/02/2014, 7:22 AM   Teryl LucyJoshua Landau, MD Cell [phone]

## 2014-01-02 NOTE — Progress Notes (Signed)
Occupational Therapy Treatment and Discharge Patient Details Name: Molly FessMary A Haik MRN: [id number] DOB: 09/11/1935 Today's Date: 01/02/2014    History of present illness 78 y.o. female [facility] s/p fall with resultant IM nail of the femur.  Pt with significant PMHx of HTN.   OT comments  This 78 yo female female seen today due to change in D/C plan from SNF to Home. No goals had been set by myself on eval due to at that time her D/C plan was SNF; however I was fully aware of what needed to be addressed this session due to having evaled her on 12/31/13. All BADLs addressed and pt without further questions about BADLs. Acute OT will now sign off.  Follow Up Recommendations  Home health OT    Equipment Recommendations  3 in 1 bedside comode       Precautions / Restrictions Precautions Precautions: Fall Restrictions Weight Bearing Restrictions: No RLE Weight Bearing: Weight bearing as tolerated              ADL                                         General ADL Comments: Pt reports that family will A her with LBADLs until she can do them for herself again. I made her aware that whether they are helping her or she is trying to dress her LB herself, that she needs to put her RLE in first--she verbalized understanding. She reports that she will do sponge baths until she gets to the point that she can step into her tub/shower combo with door. I explained and demonstrated to her she needs to adjust her 3n1 to fit over her toliet or how she can use it next to the bed at night if need be with the bucket in it--she verbalized understanding. I offered to have her practice toilet transfer to 3n1 and she politely declined stating you do not realize how many times I have been up to the bathroom, I do not need to practice.                Cognition   Behavior During Therapy: WFL for tasks assessed/performed Overall Cognitive Status: Within Functional Limits  for tasks assessed                                    Pertinent Vitals/ Pain       Pain Assessment: 0-10 Pain Score: 1  (at rest) Pain Location: right leg Pain Descriptors / Indicators: Aching Pain Intervention(s): No intervention needed            Progress Toward Goals  OT Goals(current goals can now be found in the care plan section)  Progress towards OT goals:  (All acute OT education completed)  Acute Rehab OT Goals Patient Stated Goal: pt would like to avoid SNF and if she doesn't qualify for CIR she would like home with home health.   Plan Discharge plan needs to be updated          Activity Tolerance Patient tolerated treatment well   Patient Left in chair;with call bell/phone within reach           Time: 1115-1125 OT Time Calculation (min): 10 min  Charges: OT General Charges $OT Visit: 1 Procedure  OT Treatments $Self Care/Home Management : 8-22 mins  Evette GeorgesLeonard, Josaiah Muhammed Eva [phone] 01/02/2014, 12:25 PM

## 2014-01-02 NOTE — Progress Notes (Signed)
Physical medicine rehabilitation consult had been requested chart reviewed. Recommendations by both physical and occupational therapy is for skilled nursing facility. Patient does not have the necessary supervision at home for time of discharge. Also it is highly unlikely that Molly [name] insurance will justify inpatient rehabilitation services for this diagnoses. Recommendations are for skilled nursing facility

## 2014-01-02 NOTE — Care Management Note (Signed)
CARE MANAGEMENT NOTE 01/02/2014  Patient:  Molly [name],Molly [name]   Account Number:  [id number]  Date Initiated:  12/30/2013  Documentation initiated by:  Vance PeperBRADY,Edrees Valent  Subjective/Objective Assessment:   78 yr old female admitted s/p fall, with [name] right femur fracture. Patient had [name] right femur IM Nailing.     Action/Plan:   Physical therapy has reccommended that patient will need shortterm rehab . Social worker has been notified.  11/30- patient has progressed to be able to go home. [facility]  Referral called to GirardMiranda, [facility].   Anticipated DC Date:  01/02/2014   Anticipated DC Plan:  HOME W HOME HEALTH SERVICES  In-house referral  Clinical Social Worker      DC Planning Services  CM consult      PAC Choice  DURABLE MEDICAL EQUIPMENT  HOME HEALTH   Choice offered to / List presented to:  C-1 Patient   DME arranged  3-N-1  WALKER - ROLLING        HH arranged  HH-2 PT  HH-3 OT      [facility]  [facility]   Status of service:  Completed, signed off Medicare Important Message given?  YES (If response is "NO", the following Medicare IM given date fields will be blank) Date Medicare IM given:  12/30/2013 Medicare IM given by:  Georgianne FickADEKUNLE,TOLA Date Additional Medicare IM given:   Additional Medicare IM given by:    Discharge Disposition:  HOME W HOME HEALTH SERVICES  Per UR Regulation:  Reviewed for med. necessity/level of care/duration of stay  If discussed at Long Length of Stay Meetings, dates discussed:    Comments:

## 2014-01-02 NOTE — Progress Notes (Signed)
Physical Therapy Treatment Patient Details Name: Molly [name] MRN: [id number] DOB: 09/11/1935 Today's Date: 01/02/2014    History of Present Illness 78 y.o. female [facility] s/p fall with resultant IM nail of the femur.  Pt with significant PMHx of HTN.    PT Comments    Pt is progressing well with her mobility. She was found ambulating unassisted in the hallway with her walker reporting no one answered her call bell, so she got up.  She showed the ability with minimal assist to get up the stairs to simulate home entry.  She continues to be appropriate for HHPT at discharge.  PT will continue to follow acutely.    Follow Up Recommendations  Home health PT     Equipment Recommendations  Rolling walker with 5" wheels;3in1 (PT)    Recommendations for Other Services   NA     Precautions / Restrictions Precautions Precautions: Fall Restrictions Weight Bearing Restrictions: No RLE Weight Bearing: Weight bearing as tolerated    Mobility  Bed Mobility Overal bed mobility: Needs Assistance Bed Mobility: Sit to Supine       Sit to supine: Supervision   General bed mobility comments: Pt, with some difficulty, was able to lift bil legs into the bed.  It took multiple attempts for the right leg, but she was able to do it to a flat bed without external assistance.  She still relies heavily on bed rails to scoot up in the bed.   Transfers Overall transfer level: Needs assistance Equipment used: Rolling walker (2 wheeled) Transfers: Sit to/from Stand Sit to Stand: Supervision         General transfer comment: supervision for safety.   Ambulation/Gait Ambulation/Gait assistance: Supervision Ambulation Distance (Feet): 200 Feet Assistive device: Rolling walker (2 wheeled) Gait Pattern/deviations: Step-through pattern;Antalgic;Trunk flexed Gait velocity: decreased Gait velocity interpretation: Below normal speed for age/gender General Gait Details:  supervision for safety due to abnormal gait pattern.  Pt becomes more flexed with fatigue and needs cues for upright posture.  Therapist adjusted her home walker down to her height.    Stairs Stairs: Yes Stairs assistance: Min guard Stair Management: Two rails;Backwards Number of Stairs: 2 General stair comments: Pt demonstrated the ability to go up and down the stairs (backwards up, forwards down) with bil hand rails.  We discussed correct LE sequencing as well as how her son and husband would act as her railings for home entry (as she has none) at home.          Balance Overall balance assessment: Needs assistance Sitting-balance support: Feet supported;No upper extremity supported Sitting balance-Leahy Scale: Good     Standing balance support: Bilateral upper extremity supported;Single extremity supported;No upper extremity supported Standing balance-Leahy Scale: Fair                      Cognition Arousal/Alertness: Awake/alert Behavior During Therapy: WFL for tasks assessed/performed Overall Cognitive Status: Within Functional Limits for tasks assessed                             Pertinent Vitals/Pain Pain Assessment: 0-10 Pain Score: 5  Pain Location: right leg Pain Descriptors / Indicators: Aching Pain Intervention(s): Limited activity within patient's tolerance;Monitored during session;Repositioned           PT Goals (current goals can now be found in the care plan section) Acute Rehab PT Goals Patient Stated Goal: pt  would like to avoid SNF and if she doesn't qualify for CIR she would like home with home health.  Progress towards PT goals: Progressing toward goals    Frequency  Min 5X/week    PT Plan Current plan remains appropriate       End of Session   Activity Tolerance: Patient limited by pain Patient left: in bed;with call bell/phone within reach     Time: 1309-1320 PT Time Calculation (min) (ACUTE ONLY): 11  min  Charges:  $Gait Training: 8-22 mins            Hubert Derstine B. Amdrew Oboyle, PT, DPT [phone]   01/02/2014, 2:04 PM

## 2014-01-02 NOTE — Progress Notes (Signed)
Utilization review completed.  

## 2014-01-02 NOTE — Progress Notes (Signed)
[facility] PROGRESS NOTE  Molly [name] ZHY:[id number] DOB: 09/11/1935 DOA: 12/28/2013 PCP: Lillia MountainGRIFFIN,JOHN JOSEPH, MD  Brief narrative: Ms. Molly [name] uis a 78yo woman with PMH of HTN, presented with a Fall and right sided femur fracture.  Underwent IM nail surgery on 11/25.  PT recommending home health PT given her improvement.    Assessment and Plan:    Subtrochanteric fracture of right femur s/p surgical correction - Patient progressing well per discussion with orthopedics and PT notes - PT now recommending home health with supervision - Stool softeners, pain control - Can d/c today if home health can be set up.  Essential hypertension - well controlled - She is home medications including amlodipine-benazepril, labetalol - home HCTZ on hold still, will continue to hold given BP's - Consider restarting in the outpatient setting.    Hypothyroidism - No change; Continue home synthroid    Hypokalemia - Resolved, but mildly low yesterday.   - Restarted home dose of KCl 10 mEq per day  Anemia - Stable, recheck as an outpatient.   Mild hypocalcemia - Corrected to 9.2 with albumin, likely normal  DVT prophylaxis  Lovenox SQ while pt is in hospital  Code Status: Full Family Communication: Pt at bedside Disposition Plan: D/C home with home health today   IV Access:   Peripheral IV Procedures and diagnostic studies:    No results found.  Medical Consultants:   Orthopedics  Other Consultants:   Physical therapy  Anti-Infectives:   none  Debe CoderMULLEN, Molly Cousineau, MD  [facility] Pager [phone]  If 7PM-7AM, please contact night-coverage [email or web address] Password [email or web address] 01/02/2014, 7:12 AM   LOS: 5 days   HPI/Subjective: No events overnight.  She is progressing well.  Still does not understand whey she cannot go to CIR, but seems happy to be going home with help at home.    Objective: Filed Vitals:   01/01/14 0540 01/01/14 1427 01/01/14 2044 01/02/14 0530  BP: 117/51 110/43  114/44 128/51  Pulse: 95 79 81 82  Temp: 99 F (37.2 C) 98.7 F (37.1 C) 98.1 F (36.7 C) 98.1 F (36.7 C)  TempSrc: Oral Oral Oral Oral  Resp: 18 18 18 18   Height:      Weight:      SpO2: 94% 96% 95% 98%    Intake/Output Summary (Last 24 hours) at 01/02/14 [id number] Last data filed at 01/02/14 0535  Gross per 24 hour  Intake    960 ml  Output      0 ml  Net    960 ml    Exam:   General:  Pt is alert and oriented, NAD, elderly woman  Cardiovascular: RR, NR, S1/S2  Respiratory: CTAB, no wheezing  Abdomen: Soft, non tender, non distended, + BS  Extremities: No edema, moving both LE, warm  Neuro: Grossly nonfocal  Data Reviewed: Basic Metabolic Panel:  Recent Labs Lab 12/28/13 1455 12/29/13 0340 12/30/13 0345 01/01/14 0335  NA 137 138 136* 135*  K 3.4* 3.3* 3.7 3.5*  CL 98 99 100 97  CO2 26 28 27 29   GLUCOSE 119* 153* 106* 115*  BUN 25* 20 13 9   CREATININE 0.83 0.61  0.61 0.56 0.50  CALCIUM 9.0 8.1* 7.7* 8.1*   CBC:  Recent Labs Lab 12/28/13 1455 12/29/13 0340 12/30/13 0345 12/31/13 0337 01/01/14 0335  WBC 8.4 12.1*  11.8* 6.7 8.9 7.5  NEUTROABS 6.3  --   --   --   --   HGB 11.6* 9.1*  9.0* 7.4* 9.0*  8.8*  HCT 35.1* 27.2*  27.5* 22.2* 27.8* 26.8*  MCV 87.3 89.8  89.6 89.9 85.8 86.7  PLT 206 191  184 142* 156 180     Recent Results (from the past 240 hour(s))  MRSA PCR Screening     Status: None   Collection Time: 12/28/13  8:41 PM  Result Value Ref Range Status   MRSA by PCR NEGATIVE NEGATIVE Final    Comment:        The GeneXpert MRSA Assay (FDA approved for NASAL specimens only), is one component of a comprehensive MRSA colonization surveillance program. It is not intended to diagnose MRSA infection nor to guide or monitor treatment for MRSA infections.      Scheduled Meds: . amLODipine  10 mg Oral Daily  . benazepril  20 mg Oral Daily  . docusate sodium  200 mg Oral BID  . enoxaparin (LOVENOX) injection  40 mg  Subcutaneous Q24H  . labetalol  300 mg Oral BID  . levothyroxine  88 mcg Oral QAC breakfast  . methocarbamol  500 mg Oral TID  . potassium chloride  10 mEq Oral Daily  . senna  1 tablet Oral BID   Continuous Infusions:

## 2014-07-28 ENCOUNTER — Other Ambulatory Visit (HOSPITAL_COMMUNITY): Payer: Self-pay | Admitting: *Deleted

## 2014-07-31 ENCOUNTER — Encounter (HOSPITAL_COMMUNITY)
Admission: RE | Admit: 2014-07-31 | Discharge: 2014-07-31 | Disposition: A | Discharge status: Home / Self Care | Payer: Medicare Other | Source: Ambulatory Visit | Attending: Internal Medicine | Admitting: Internal Medicine

## 2014-07-31 DIAGNOSIS — M810 Age-related osteoporosis without current pathological fracture: Secondary | ICD-10-CM | POA: Insufficient documentation

## 2014-07-31 MED ORDER — SODIUM CHLORIDE 0.9 % IV SOLN
Freq: Once | INTRAVENOUS | Status: DC
Start: 2014-07-31 — End: 2014-08-01

## 2014-07-31 MED ORDER — ZOLEDRONIC ACID 5 MG/100ML IV SOLN
INTRAVENOUS | Status: AC
  Administered 2014-07-31: 5 mg via INTRAVENOUS
  Filled 2014-07-31: qty 100

## 2014-07-31 MED ORDER — ZOLEDRONIC ACID 5 MG/100ML IV SOLN
5.0000 mg | Freq: Once | INTRAVENOUS | Status: AC
  Administered 2014-07-31: 5 mg via INTRAVENOUS

## 2014-07-31 NOTE — Discharge Instructions (Signed)

## 2015-09-19 ENCOUNTER — Other Ambulatory Visit: Payer: Self-pay | Admitting: Gastroenterology

## 2015-10-25 ENCOUNTER — Encounter (HOSPITAL_COMMUNITY): Payer: Self-pay | Admitting: *Deleted

## 2015-10-29 ENCOUNTER — Encounter (HOSPITAL_COMMUNITY)
Admission: RE | Disposition: A | Discharge status: Home / Self Care | Payer: Self-pay | Source: Ambulatory Visit | Attending: Gastroenterology

## 2015-10-29 ENCOUNTER — Encounter (HOSPITAL_COMMUNITY): Payer: Self-pay | Admitting: *Deleted

## 2015-10-29 ENCOUNTER — Ambulatory Visit (HOSPITAL_COMMUNITY)
Admission: RE | Admit: 2015-10-29 | Discharge: 2015-10-29 | Disposition: A | Discharge status: Home / Self Care | Payer: Medicare Other | Source: Ambulatory Visit | Attending: Gastroenterology | Admitting: Gastroenterology

## 2015-10-29 ENCOUNTER — Ambulatory Visit (HOSPITAL_COMMUNITY): Payer: Medicare Other | Admitting: Certified Registered Nurse Anesthetist

## 2015-10-29 ENCOUNTER — Encounter (HOSPITAL_COMMUNITY): Payer: Self-pay | Admitting: Orthopedic Surgery

## 2015-10-29 DIAGNOSIS — Z1211 Encounter for screening for malignant neoplasm of colon: Secondary | ICD-10-CM | POA: Diagnosis present

## 2015-10-29 DIAGNOSIS — I10 Essential (primary) hypertension: Secondary | ICD-10-CM | POA: Insufficient documentation

## 2015-10-29 DIAGNOSIS — E039 Hypothyroidism, unspecified: Secondary | ICD-10-CM | POA: Diagnosis not present

## 2015-10-29 DIAGNOSIS — Z79899 Other long term (current) drug therapy: Secondary | ICD-10-CM | POA: Diagnosis not present

## 2015-10-29 DIAGNOSIS — E7800 Pure hypercholesterolemia, unspecified: Secondary | ICD-10-CM | POA: Insufficient documentation

## 2015-10-29 HISTORY — DX: Hypothyroidism, unspecified: E03.9

## 2015-10-29 HISTORY — DX: Sciatica, unspecified side: M54.30

## 2015-10-29 HISTORY — PX: COLONOSCOPY WITH PROPOFOL: SHX5780

## 2015-10-29 HISTORY — DX: Unspecified osteoarthritis, unspecified site: M19.90

## 2015-10-29 SURGERY — COLONOSCOPY WITH PROPOFOL
Anesthesia: Monitor Anesthesia Care

## 2015-10-29 MED ORDER — SODIUM CHLORIDE 0.9 % IV SOLN: INTRAVENOUS | Status: DC

## 2015-10-29 MED ORDER — PROPOFOL 10 MG/ML IV BOLUS
INTRAVENOUS | Status: DC | PRN
  Administered 2015-10-29 (×4): 20 mg via INTRAVENOUS

## 2015-10-29 MED ORDER — PROPOFOL 500 MG/50ML IV EMUL
INTRAVENOUS | Status: DC | PRN
  Administered 2015-10-29: 75 ug/kg/min via INTRAVENOUS

## 2015-10-29 MED ORDER — LIDOCAINE 2% (20 MG/ML) 5 ML SYRINGE
INTRAMUSCULAR | Status: DC | PRN
  Administered 2015-10-29: 100 mg via INTRAVENOUS

## 2015-10-29 MED ORDER — ONDANSETRON HCL 4 MG/2ML IJ SOLN
INTRAMUSCULAR | Status: DC | PRN
  Administered 2015-10-29: 4 mg via INTRAVENOUS

## 2015-10-29 MED ORDER — LACTATED RINGERS IV SOLN
INTRAVENOUS | Status: DC
  Administered 2015-10-29: 1000 mL via INTRAVENOUS

## 2015-10-29 SURGICAL SUPPLY — 22 items

## 2015-10-29 NOTE — H&P (Signed)
Procedure: Screening colonoscopy. Normal screening colonoscopy was performed on 03/27/2003  History: The patient is a 80 year old female born 06/18/1935. She is scheduled to undergo a repeat screening colonoscopy today  Past medical history: Hypertension. Hypercholesterolemia. Degenerative disc disease. Lumbar spinal stenosis. Hypothyroidism. Osteoarthritis of the hands. Bilateral cataract surgery. Right sub-trochanteric femur fracture surgery.  Medication allergies: Penicillin  Exam: The patient is alert and lying comfortably on the endoscopy stretcher. Abdomen is soft and nontender to palpation. Lungs are clear to auscultation. Cardiac exam reveals a regular rhythm.  Plan: Proceed with screening colonoscopy

## 2015-10-29 NOTE — Discharge Instructions (Signed)

## 2015-10-29 NOTE — Anesthesia Postprocedure Evaluation (Signed)
Anesthesia Post Note  Patient: Molly [name]  Procedure(s) Performed: Procedure(s) (LRB): COLONOSCOPY WITH PROPOFOL (N/A)  Patient location during evaluation: Endoscopy Anesthesia Type: MAC Level of consciousness: awake Pain management: pain level controlled Vital Signs Assessment: post-procedure vital signs reviewed and stable Respiratory status: spontaneous breathing Cardiovascular status: stable Postop Assessment: no signs of nausea or vomiting Anesthetic complications: no    Last Vitals:  Vitals:   10/29/15 1349 10/29/15 1400  BP: (!) 152/66 (!) 167/74  Pulse: 73 74  Resp: 18 18  Temp: 36.6 C     Last Pain:  Vitals:   10/29/15 1349  TempSrc: Oral                 Draven Laine

## 2015-10-29 NOTE — Op Note (Signed)
[facility] Patient Name: Molly [name] Procedure Date: 10/29/2015 MRN: [id number] Attending MD: Charolett BumpersMartin K Reveca Desmarais , MD Date of Birth: 07/14/1935 CSN: [id number] Age: 80 Admit Type: Outpatient Procedure:                Colonoscopy Indications:              Screening for colorectal malignant neoplasm Providers:                Charolett BumpersMartin K. Aeon Kessner, MD, Dwain SarnaPatricia Ford, RN, Clearnce SorrelKatie                            Smith, Technician, Waymond CeraJosh Jarvela, CRNA Referring MD:              Medicines:                Propofol per Anesthesia Complications:            No immediate complications. Estimated Blood Loss:     Estimated blood loss: none. Procedure:                Pre-Anesthesia Assessment:                           - Prior to the procedure, a History and Physical                            was performed, and patient medications and                            allergies were reviewed. The patient's tolerance of                            previous anesthesia was also reviewed. The risks                            and benefits of the procedure and the sedation                            options and risks were discussed with the patient.                            All questions were answered, and informed consent                            was obtained. Prior Anticoagulants: The patient has                            taken no previous anticoagulant or antiplatelet                            agents. ASA Grade Assessment: III - A patient with                            severe systemic disease. After reviewing the risks  and benefits, the patient was deemed in                            satisfactory condition to undergo the procedure.                           After obtaining informed consent, the colonoscope                            was passed under direct vision. Throughout the                            procedure, the patient's blood pressure, pulse, and              oxygen saturations were monitored continuously. The                            EC-[id number] ([id number]) scope was introduced through                            the anus and advanced to the the cecum, identified                            by appendiceal orifice and ileocecal valve. The                            colonoscopy was performed without difficulty. The                            patient tolerated the procedure well. The quality                            of the bowel preparation was good. The appendiceal                            orifice and the rectum were photographed. Scope In: 1:27:00 PM Scope Out: 1:41:45 PM Scope Withdrawal Time: 0 hours 7 minutes 10 seconds  Total Procedure Duration: 0 hours 14 minutes 45 seconds  Findings:      The perianal and digital rectal examinations were normal.      The entire examined colon appeared normal. Impression:               - The entire examined colon is normal.                           - No specimens collected. Moderate Sedation:      N/A- Per Anesthesia Care Recommendation:           - Patient has a contact number available for                            emergencies. The signs and symptoms of potential                            delayed complications were discussed with the  patient. Return to normal activities tomorrow.                            Written discharge instructions were provided to the                            patient.                           - Repeat colonoscopy is not recommended for                            screening purposes.                           - Resume previous diet.                           - Continue present medications. Procedure Code(s):        --- Professional ---                           Z6109, Colorectal cancer screening; colonoscopy on                            individual not meeting criteria for high risk Diagnosis Code(s):        --- Professional ---                            Z12.11, Encounter for screening for malignant                            neoplasm of colon CPT copyright 2016 [employer]. All rights reserved. The codes documented in this report are preliminary and upon coder review may  be revised to meet current compliance requirements. Danise Edge, MD Charolett Bumpers, MD 10/29/2015 1:48:58 PM This report has been signed electronically. Number of Addenda: 0

## 2015-10-29 NOTE — Anesthesia Preprocedure Evaluation (Signed)
Anesthesia Evaluation  Patient identified by MRN, date of birth, ID band Patient awake    Reviewed: Allergy & Precautions, NPO status , Patient's Chart, lab work & pertinent test results  History of Anesthesia Complications Negative for: history of anesthetic complications  Airway Mallampati: II  TM Distance: >3 FB Neck ROM: Full    Dental  (+) Partial Lower   Pulmonary neg pulmonary ROS,    breath sounds clear to auscultation       Cardiovascular hypertension, Pt. on medications and Pt. on home beta blockers  Rhythm:Regular     Neuro/Psych  Neuromuscular disease negative psych ROS   GI/Hepatic negative GI ROS, Neg liver ROS,   Endo/Other  Hypothyroidism   Renal/GU negative Renal ROS     Musculoskeletal  (+) Arthritis ,   Abdominal   Peds  Hematology negative hematology ROS (+)   Anesthesia Other Findings   Reproductive/Obstetrics                             Anesthesia Physical Anesthesia Plan  ASA: II  Anesthesia Plan: MAC   Post-op Pain Management:    Induction: Intravenous  Airway Management Planned: Natural Airway, Nasal Cannula and Simple Face Mask  Additional Equipment: None  Intra-op Plan:   Post-operative Plan:   Informed Consent: I have reviewed the patients History and Physical, chart, labs and discussed the procedure including the risks, benefits and alternatives for the proposed anesthesia with the patient or authorized representative who has indicated his/her understanding and acceptance.   Dental advisory given  Plan Discussed with: CRNA and Surgeon  Anesthesia Plan Comments:         Anesthesia Quick Evaluation

## 2015-10-29 NOTE — Transfer of Care (Signed)
Immediate Anesthesia Transfer of Care Note  Patient: Molly [name]  Procedure(s) Performed: Procedure(s): COLONOSCOPY WITH PROPOFOL (N/A)  Patient Location: ENDO  Anesthesia Type:MAC  Level of Consciousness:  sedated, patient cooperative and responds to stimulation  Airway & Oxygen Therapy:Patient Spontanous Breathing and Patient connected to face mask oxgen  Post-op Assessment:  Report given to ENDO RN and Post -op Vital signs reviewed and stable  Post vital signs:  Reviewed and stable  Last Vitals:  Vitals:   10/29/15 1242  BP: (!) 189/77  Pulse: 91  Resp: (!) 24  Temp: 36.5 C    Complications: No apparent anesthesia complications

## 2015-10-31 ENCOUNTER — Encounter (HOSPITAL_COMMUNITY): Payer: Self-pay | Admitting: Gastroenterology

## 2016-08-15 ENCOUNTER — Other Ambulatory Visit (HOSPITAL_COMMUNITY): Payer: Self-pay | Admitting: *Deleted

## 2016-08-18 ENCOUNTER — Ambulatory Visit (HOSPITAL_COMMUNITY)
Admission: RE | Admit: 2016-08-18 | Discharge: 2016-08-18 | Disposition: A | Discharge status: Home / Self Care | Payer: Medicare Other | Source: Ambulatory Visit | Attending: Internal Medicine | Admitting: Internal Medicine

## 2016-08-18 DIAGNOSIS — M810 Age-related osteoporosis without current pathological fracture: Secondary | ICD-10-CM | POA: Diagnosis not present

## 2016-08-18 MED ORDER — ZOLEDRONIC ACID 5 MG/100ML IV SOLN
5.0000 mg | Freq: Once | INTRAVENOUS | Status: AC
  Administered 2016-08-18: 5 mg via INTRAVENOUS

## 2016-08-18 MED ORDER — ZOLEDRONIC ACID 5 MG/100ML IV SOLN
INTRAVENOUS | Status: AC
  Administered 2016-08-18: 5 mg via INTRAVENOUS
  Filled 2016-08-18: qty 100

## 2017-04-01 ENCOUNTER — Emergency Department (HOSPITAL_COMMUNITY): Payer: Medicare Other

## 2017-04-01 ENCOUNTER — Emergency Department (HOSPITAL_COMMUNITY)
Admission: EM | Admit: 2017-04-01 | Discharge: 2017-04-01 | Disposition: A | Discharge status: Home / Self Care | Payer: Medicare Other | Attending: Emergency Medicine | Admitting: Emergency Medicine

## 2017-04-01 ENCOUNTER — Encounter (HOSPITAL_COMMUNITY): Payer: Self-pay | Admitting: Emergency Medicine

## 2017-04-01 DIAGNOSIS — R0789 Other chest pain: Secondary | ICD-10-CM | POA: Insufficient documentation

## 2017-04-01 DIAGNOSIS — Z7902 Long term (current) use of antithrombotics/antiplatelets: Secondary | ICD-10-CM | POA: Insufficient documentation

## 2017-04-01 DIAGNOSIS — R079 Chest pain, unspecified: Secondary | ICD-10-CM | POA: Diagnosis present

## 2017-04-01 DIAGNOSIS — I10 Essential (primary) hypertension: Secondary | ICD-10-CM | POA: Diagnosis not present

## 2017-04-01 DIAGNOSIS — E039 Hypothyroidism, unspecified: Secondary | ICD-10-CM | POA: Insufficient documentation

## 2017-04-01 DIAGNOSIS — Y9389 Activity, other specified: Secondary | ICD-10-CM | POA: Insufficient documentation

## 2017-04-01 DIAGNOSIS — Y999 Unspecified external cause status: Secondary | ICD-10-CM | POA: Insufficient documentation

## 2017-04-01 DIAGNOSIS — Y92410 Unspecified street and highway as the place of occurrence of the external cause: Secondary | ICD-10-CM | POA: Insufficient documentation

## 2017-04-01 DIAGNOSIS — Z79899 Other long term (current) drug therapy: Secondary | ICD-10-CM | POA: Insufficient documentation

## 2017-04-01 LAB — CBC WITH DIFFERENTIAL/PLATELET
Basophils Absolute: 0 10*3/uL (ref 0.0–0.1)
Basophils Relative: 0 %
Eosinophils Absolute: 0.2 10*3/uL (ref 0.0–0.7)
Eosinophils Relative: 1 %
HCT: 37.4 % (ref 36.0–46.0)
Hemoglobin: 12.3 g/dL (ref 12.0–15.0)
Lymphocytes Relative: 14 %
Lymphs Abs: 1.8 10*3/uL (ref 0.7–4.0)
MCH: 29.1 pg (ref 26.0–34.0)
MCHC: 32.9 g/dL (ref 30.0–36.0)
MCV: 88.4 fL (ref 78.0–100.0)
Monocytes Absolute: 0.7 10*3/uL (ref 0.1–1.0)
Monocytes Relative: 5 %
Neutro Abs: 10.4 10*3/uL — ABNORMAL HIGH (ref 1.7–7.7)
Neutrophils Relative %: 80 %
Platelets: 208 10*3/uL (ref 150–400)
RBC: 4.23 MIL/uL (ref 3.87–5.11)
RDW: 14 % (ref 11.5–15.5)
WBC: 13 10*3/uL — ABNORMAL HIGH (ref 4.0–10.5)

## 2017-04-01 LAB — BASIC METABOLIC PANEL
Anion gap: 10 (ref 5–15)
BUN: 23 mg/dL — ABNORMAL HIGH (ref 6–20)
CO2: 25 mmol/L (ref 22–32)
Calcium: 9.2 mg/dL (ref 8.9–10.3)
Chloride: 102 mmol/L (ref 101–111)
Creatinine, Ser: 1 mg/dL (ref 0.44–1.00)
GFR calc Af Amer: >60 mL/min (ref >60)
GFR calc non Af Amer: 52 mL/min — ABNORMAL LOW (ref >60)
Glucose, Bld: 122 mg/dL — ABNORMAL HIGH (ref 65–99)
Potassium: 3.5 mmol/L (ref 3.5–5.1)
Sodium: 137 mmol/L (ref 135–145)

## 2017-04-01 LAB — I-STAT TROPONIN, ED
Troponin i, poc: 0.01 ng/mL (ref 0.00–0.08)
Troponin i, poc: 0 ng/mL (ref 0.00–0.08)

## 2017-04-01 MED ORDER — CYCLOBENZAPRINE HCL 5 MG PO TABS: 5.0000 mg | ORAL_TABLET | Freq: Two times a day (BID) | ORAL | 0 refills | Status: AC | PRN

## 2017-04-01 MED ORDER — HYDROCODONE-ACETAMINOPHEN 5-325 MG PO TABS: 1.0000 | ORAL_TABLET | Freq: Four times a day (QID) | ORAL | 0 refills | Status: AC | PRN

## 2017-04-01 MED ORDER — HYDROCODONE-ACETAMINOPHEN 5-325 MG PO TABS
2.0000 | ORAL_TABLET | Freq: Once | ORAL | Status: AC
  Administered 2017-04-01: 2 via ORAL
  Filled 2017-04-01: qty 2
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
## 2018-09-20 ENCOUNTER — Other Ambulatory Visit: Payer: Self-pay | Admitting: Internal Medicine

## 2018-09-20 DIAGNOSIS — M810 Age-related osteoporosis without current pathological fracture: Secondary | ICD-10-CM

## 2018-12-06 ENCOUNTER — Other Ambulatory Visit: Payer: Medicare Other

## 2019-03-23 ENCOUNTER — Other Ambulatory Visit: Payer: Self-pay | Admitting: Internal Medicine

## 2019-03-23 DIAGNOSIS — M810 Age-related osteoporosis without current pathological fracture: Secondary | ICD-10-CM

## 2019-06-27 ENCOUNTER — Ambulatory Visit
Admission: RE | Admit: 2019-06-27 | Discharge: 2019-06-27 | Disposition: A | Discharge status: Home / Self Care | Payer: Medicare Other | Source: Ambulatory Visit | Attending: Internal Medicine | Admitting: Internal Medicine

## 2019-06-27 ENCOUNTER — Other Ambulatory Visit: Payer: Self-pay

## 2019-06-27 DIAGNOSIS — M810 Age-related osteoporosis without current pathological fracture: Secondary | ICD-10-CM

## 2020-02-13 ENCOUNTER — Other Ambulatory Visit: Payer: Self-pay

## 2020-02-13 DIAGNOSIS — Z20822 Contact with and (suspected) exposure to covid-19: Secondary | ICD-10-CM

## 2020-03-22 DIAGNOSIS — J3089 Other allergic rhinitis: Secondary | ICD-10-CM | POA: Diagnosis not present

## 2020-03-22 DIAGNOSIS — Z23 Encounter for immunization: Secondary | ICD-10-CM | POA: Diagnosis not present

## 2020-03-22 DIAGNOSIS — I10 Essential (primary) hypertension: Secondary | ICD-10-CM | POA: Diagnosis not present

## 2020-09-27 DIAGNOSIS — M810 Age-related osteoporosis without current pathological fracture: Secondary | ICD-10-CM | POA: Diagnosis not present

## 2020-09-27 DIAGNOSIS — Z1389 Encounter for screening for other disorder: Secondary | ICD-10-CM | POA: Diagnosis not present

## 2020-09-27 DIAGNOSIS — E7800 Pure hypercholesterolemia, unspecified: Secondary | ICD-10-CM | POA: Diagnosis not present

## 2020-09-27 DIAGNOSIS — Z0000 Encounter for general adult medical examination without abnormal findings: Secondary | ICD-10-CM | POA: Diagnosis not present

## 2020-09-27 DIAGNOSIS — R7301 Impaired fasting glucose: Secondary | ICD-10-CM | POA: Diagnosis not present

## 2020-09-27 DIAGNOSIS — E039 Hypothyroidism, unspecified: Secondary | ICD-10-CM | POA: Diagnosis not present

## 2020-09-27 DIAGNOSIS — I10 Essential (primary) hypertension: Secondary | ICD-10-CM | POA: Diagnosis not present

## 2021-01-23 DIAGNOSIS — U071 COVID-19: Secondary | ICD-10-CM | POA: Diagnosis not present

## 2021-01-23 DIAGNOSIS — M791 Myalgia, unspecified site: Secondary | ICD-10-CM | POA: Diagnosis not present

## 2021-05-14 DIAGNOSIS — K2900 Acute gastritis without bleeding: Secondary | ICD-10-CM | POA: Diagnosis not present

## 2021-05-14 DIAGNOSIS — K30 Functional dyspepsia: Secondary | ICD-10-CM | POA: Diagnosis not present

## 2021-05-22 DIAGNOSIS — M545 Low back pain, unspecified: Secondary | ICD-10-CM | POA: Diagnosis not present

## 2021-05-30 DIAGNOSIS — M545 Low back pain, unspecified: Secondary | ICD-10-CM | POA: Diagnosis not present

## 2021-06-03 DIAGNOSIS — M545 Low back pain, unspecified: Secondary | ICD-10-CM | POA: Diagnosis not present

## 2021-06-19 DIAGNOSIS — M533 Sacrococcygeal disorders, not elsewhere classified: Secondary | ICD-10-CM | POA: Diagnosis not present

## 2021-06-19 DIAGNOSIS — R0781 Pleurodynia: Secondary | ICD-10-CM | POA: Diagnosis not present

## 2021-06-19 DIAGNOSIS — M545 Low back pain, unspecified: Secondary | ICD-10-CM | POA: Diagnosis not present

## 2021-07-03 DIAGNOSIS — M533 Sacrococcygeal disorders, not elsewhere classified: Secondary | ICD-10-CM | POA: Diagnosis not present

## 2021-07-12 DIAGNOSIS — R296 Repeated falls: Secondary | ICD-10-CM | POA: Diagnosis not present

## 2021-08-01 DIAGNOSIS — M533 Sacrococcygeal disorders, not elsewhere classified: Secondary | ICD-10-CM | POA: Diagnosis not present

## 2021-10-08 DIAGNOSIS — Z0000 Encounter for general adult medical examination without abnormal findings: Secondary | ICD-10-CM | POA: Diagnosis not present

## 2021-10-08 DIAGNOSIS — E039 Hypothyroidism, unspecified: Secondary | ICD-10-CM | POA: Diagnosis not present

## 2021-10-08 DIAGNOSIS — R7301 Impaired fasting glucose: Secondary | ICD-10-CM | POA: Diagnosis not present

## 2021-10-08 DIAGNOSIS — E7800 Pure hypercholesterolemia, unspecified: Secondary | ICD-10-CM | POA: Diagnosis not present

## 2021-10-08 DIAGNOSIS — I10 Essential (primary) hypertension: Secondary | ICD-10-CM | POA: Diagnosis not present

## 2021-10-08 DIAGNOSIS — M810 Age-related osteoporosis without current pathological fracture: Secondary | ICD-10-CM | POA: Diagnosis not present

## 2021-10-22 DIAGNOSIS — I10 Essential (primary) hypertension: Secondary | ICD-10-CM | POA: Diagnosis not present

## 2021-10-22 DIAGNOSIS — E871 Hypo-osmolality and hyponatremia: Secondary | ICD-10-CM | POA: Diagnosis not present

## 2021-11-05 DIAGNOSIS — E871 Hypo-osmolality and hyponatremia: Secondary | ICD-10-CM | POA: Diagnosis not present

## 2021-11-05 DIAGNOSIS — I10 Essential (primary) hypertension: Secondary | ICD-10-CM | POA: Diagnosis not present

## 2021-11-27 DIAGNOSIS — R262 Difficulty in walking, not elsewhere classified: Secondary | ICD-10-CM | POA: Diagnosis not present

## 2021-11-27 DIAGNOSIS — I10 Essential (primary) hypertension: Secondary | ICD-10-CM | POA: Diagnosis not present

## 2021-12-18 DIAGNOSIS — K219 Gastro-esophageal reflux disease without esophagitis: Secondary | ICD-10-CM | POA: Diagnosis not present

## 2021-12-18 DIAGNOSIS — G8929 Other chronic pain: Secondary | ICD-10-CM | POA: Diagnosis not present

## 2021-12-18 DIAGNOSIS — M545 Low back pain, unspecified: Secondary | ICD-10-CM | POA: Diagnosis not present

## 2021-12-18 DIAGNOSIS — E871 Hypo-osmolality and hyponatremia: Secondary | ICD-10-CM | POA: Diagnosis not present

## 2021-12-18 DIAGNOSIS — I10 Essential (primary) hypertension: Secondary | ICD-10-CM | POA: Diagnosis not present

## 2022-04-07 DIAGNOSIS — M159 Polyosteoarthritis, unspecified: Secondary | ICD-10-CM | POA: Diagnosis not present

## 2022-04-07 DIAGNOSIS — I10 Essential (primary) hypertension: Secondary | ICD-10-CM | POA: Diagnosis not present

## 2022-04-07 DIAGNOSIS — E039 Hypothyroidism, unspecified: Secondary | ICD-10-CM | POA: Diagnosis not present

## 2022-06-20 DIAGNOSIS — M545 Low back pain, unspecified: Secondary | ICD-10-CM | POA: Diagnosis not present

## 2022-07-25 DIAGNOSIS — M545 Low back pain, unspecified: Secondary | ICD-10-CM | POA: Diagnosis not present

## 2022-08-12 DIAGNOSIS — I872 Venous insufficiency (chronic) (peripheral): Secondary | ICD-10-CM | POA: Diagnosis not present

## 2022-08-12 DIAGNOSIS — R1013 Epigastric pain: Secondary | ICD-10-CM | POA: Diagnosis not present

## 2022-08-13 DIAGNOSIS — R1013 Epigastric pain: Secondary | ICD-10-CM | POA: Diagnosis not present

## 2023-04-20 DIAGNOSIS — I10 Essential (primary) hypertension: Secondary | ICD-10-CM | POA: Diagnosis not present

## 2023-04-20 DIAGNOSIS — G4709 Other insomnia: Secondary | ICD-10-CM | POA: Diagnosis not present

## 2023-08-06 ENCOUNTER — Encounter (HOSPITAL_COMMUNITY): Payer: Self-pay | Admitting: Emergency Medicine

## 2023-10-19 DIAGNOSIS — R7301 Impaired fasting glucose: Secondary | ICD-10-CM | POA: Diagnosis not present

## 2023-10-19 DIAGNOSIS — M810 Age-related osteoporosis without current pathological fracture: Secondary | ICD-10-CM | POA: Diagnosis not present

## 2023-10-19 DIAGNOSIS — I10 Essential (primary) hypertension: Secondary | ICD-10-CM | POA: Diagnosis not present

## 2023-10-19 DIAGNOSIS — K219 Gastro-esophageal reflux disease without esophagitis: Secondary | ICD-10-CM | POA: Diagnosis not present

## 2023-10-19 DIAGNOSIS — M159 Polyosteoarthritis, unspecified: Secondary | ICD-10-CM | POA: Diagnosis not present

## 2023-10-19 DIAGNOSIS — Z0000 Encounter for general adult medical examination without abnormal findings: Secondary | ICD-10-CM | POA: Diagnosis not present

## 2023-10-19 DIAGNOSIS — E7800 Pure hypercholesterolemia, unspecified: Secondary | ICD-10-CM | POA: Diagnosis not present

## 2023-10-19 DIAGNOSIS — E039 Hypothyroidism, unspecified: Secondary | ICD-10-CM | POA: Diagnosis not present
